# Patient Record
Sex: Male | Born: 1970 | Race: White | Hispanic: No | Marital: Married | Smoking: Never smoker
Health system: Southern US, Community
[De-identification: ages and names within clinical notes are randomized; demographics above are authoritative.]

## PROBLEM LIST (undated history)

## (undated) DIAGNOSIS — M5126 Other intervertebral disc displacement, lumbar region: Secondary | ICD-10-CM

## (undated) DIAGNOSIS — I10 Essential (primary) hypertension: Secondary | ICD-10-CM

## (undated) DIAGNOSIS — N289 Disorder of kidney and ureter, unspecified: Secondary | ICD-10-CM

## (undated) HISTORY — PX: REFRACTIVE SURGERY: SHX103

## (undated) HISTORY — PX: BACK SURGERY: SHX140

---

## 2007-10-13 HISTORY — PX: HERNIA REPAIR: SHX51

## 2012-01-05 ENCOUNTER — Other Ambulatory Visit: Payer: Self-pay | Admitting: Otolaryngology

## 2012-01-19 ENCOUNTER — Encounter (HOSPITAL_COMMUNITY)
Admission: RE | Admit: 2012-01-19 | Discharge: 2012-01-19 | Disposition: A | Discharge status: Home / Self Care | Payer: Worker's Compensation | Source: Ambulatory Visit | Attending: Specialist | Admitting: Specialist

## 2012-01-19 ENCOUNTER — Other Ambulatory Visit: Payer: Self-pay | Admitting: Otolaryngology

## 2012-01-19 ENCOUNTER — Encounter (HOSPITAL_COMMUNITY): Payer: Self-pay

## 2012-01-19 ENCOUNTER — Ambulatory Visit (HOSPITAL_COMMUNITY)
Admission: RE | Admit: 2012-01-19 | Discharge: 2012-01-19 | Disposition: A | Discharge status: Home / Self Care | Payer: Worker's Compensation | Source: Ambulatory Visit | Attending: Otolaryngology | Admitting: Otolaryngology

## 2012-01-19 DIAGNOSIS — M545 Low back pain, unspecified: Secondary | ICD-10-CM | POA: Insufficient documentation

## 2012-01-19 DIAGNOSIS — M47817 Spondylosis without myelopathy or radiculopathy, lumbosacral region: Secondary | ICD-10-CM | POA: Insufficient documentation

## 2012-01-19 DIAGNOSIS — Z01812 Encounter for preprocedural laboratory examination: Secondary | ICD-10-CM | POA: Insufficient documentation

## 2012-01-19 DIAGNOSIS — IMO0002 Reserved for concepts with insufficient information to code with codable children: Secondary | ICD-10-CM | POA: Insufficient documentation

## 2012-01-19 HISTORY — DX: Other intervertebral disc displacement, lumbar region: M51.26

## 2012-01-19 LAB — URINALYSIS, ROUTINE W REFLEX MICROSCOPIC
Bilirubin Urine: NEGATIVE
Leukocytes, UA: NEGATIVE
Nitrite: NEGATIVE
Specific Gravity, Urine: 1.015 (ref 1.005–1.030)
pH: 5.5 (ref 5.0–8.0)

## 2012-01-19 LAB — SURGICAL PCR SCREEN: MRSA, PCR: NEGATIVE

## 2012-01-19 LAB — URINE MICROSCOPIC-ADD ON

## 2012-01-19 LAB — COMPREHENSIVE METABOLIC PANEL
ALT: 28 U/L (ref 0–53)
AST: 21 U/L (ref 0–37)
Calcium: 9.4 mg/dL (ref 8.4–10.5)
GFR calc Af Amer: >90 mL/min (ref >90)
Glucose, Bld: 84 mg/dL (ref 70–99)
Sodium: 137 mEq/L (ref 135–145)
Total Protein: 7.3 g/dL (ref 6.0–8.3)

## 2012-01-19 LAB — CBC
MCH: 30.1 pg (ref 26.0–34.0)
MCHC: 36.8 g/dL — ABNORMAL HIGH (ref 30.0–36.0)
Platelets: 266 10*3/uL (ref 150–400)

## 2012-01-19 LAB — DIFFERENTIAL
Eosinophils Relative: 5 % (ref 0–5)
Lymphocytes Relative: 36 % (ref 12–46)
Lymphs Abs: 2.3 10*3/uL (ref 0.7–4.0)
Monocytes Relative: 8 % (ref 3–12)

## 2012-01-19 MED ORDER — CHLORHEXIDINE GLUCONATE 4 % EX LIQD
60.0000 mL | Freq: Once | CUTANEOUS | Status: DC
  Filled 2012-01-19: qty 60

## 2012-01-19 NOTE — Patient Instructions (Addendum)
[name]  01/19/2012   Your procedure is scheduled on:  01/29/12  Report to SHORT STAY DEPT  at 10:30 AM.  Call this number if you have problems the morning of surgery: [phone]   Remember:   Do not eat food or drink liquids AFTER MIDNIGHT  May have clear liquids UNTIL 6 HOURS BEFORE SURGERY (7:00 AM)  Clear liquids include soda, tea, black coffee, apple or grape juice, broth.  Take these medicines the morning of surgery with A SIP OF WATER: HYDROCODONE / SOMA   Do not wear jewelry, make-up or nail polish.  Do not wear lotions, powders, or perfumes.   Do not shave legs or underarms 48 hrs. before surgery (men may shave face)  Do not bring valuables to the hospital.  Contacts, dentures or bridgework may not be worn into surgery.  Leave suitcase in the car. After surgery it may be brought to your room.  For patients admitted to the hospital, checkout time is 11:00 AM the day of discharge.   Patients discharged the day of surgery will not be allowed to drive home.  Name and phone number of your driver:   Special Instructions:   Please read over the following fact sheets that you were given: MRSA  Information / Incentive Spirometer               SHOWER WITH BETASEPT THE NIGHT BEFORE SURGERY AND THE MORNING OF SURGERY

## 2012-01-20 NOTE — Pre-Procedure Instructions (Signed)
01/19/12 MESSAGE LEFT WITH AMANDA AT OFFICE WHO SENT MESSAGE TO DR. Shelle Iron CONCERNING [facility] ON [name]

## 2012-01-29 ENCOUNTER — Ambulatory Visit (HOSPITAL_COMMUNITY): Payer: Worker's Compensation

## 2012-01-29 ENCOUNTER — Encounter (HOSPITAL_COMMUNITY): Payer: Self-pay | Admitting: Anesthesiology

## 2012-01-29 ENCOUNTER — Encounter (HOSPITAL_COMMUNITY): Payer: Self-pay | Admitting: *Deleted

## 2012-01-29 ENCOUNTER — Encounter (HOSPITAL_COMMUNITY)
Admission: RE | Disposition: A | Discharge status: Home / Self Care | Payer: Self-pay | Source: Ambulatory Visit | Attending: Specialist

## 2012-01-29 ENCOUNTER — Observation Stay (HOSPITAL_COMMUNITY)
Admission: RE | Admit: 2012-01-29 | Discharge: 2012-01-30 | Disposition: A | Discharge status: Home / Self Care | Payer: Worker's Compensation | Source: Ambulatory Visit | Attending: Specialist | Admitting: Specialist

## 2012-01-29 ENCOUNTER — Ambulatory Visit (HOSPITAL_COMMUNITY): Payer: Worker's Compensation | Admitting: Anesthesiology

## 2012-01-29 DIAGNOSIS — M5126 Other intervertebral disc displacement, lumbar region: Principal | ICD-10-CM | POA: Insufficient documentation

## 2012-01-29 SURGERY — DECOMPRESSIVE LUMBAR LAMINECTOMY LEVEL 1
Anesthesia: General | Site: Back | Laterality: Bilateral | Wound class: Clean

## 2012-01-29 MED ORDER — SODIUM CHLORIDE 0.9 % IJ SOLN: 3.0000 mL | Freq: Two times a day (BID) | INTRAMUSCULAR | Status: DC

## 2012-01-29 MED ORDER — SODIUM CHLORIDE 0.9 % IR SOLN
Status: DC | PRN
  Administered 2012-01-29: 14:00:00

## 2012-01-29 MED ORDER — PROPOFOL 10 MG/ML IV EMUL
INTRAVENOUS | Status: DC | PRN
  Administered 2012-01-29: 300 mg via INTRAVENOUS

## 2012-01-29 MED ORDER — POLYETHYLENE GLYCOL 3350 17 G PO PACK
17.0000 g | PACK | Freq: Every day | ORAL | Status: DC | PRN
  Filled 2012-01-29: qty 1

## 2012-01-29 MED ORDER — ACETAMINOPHEN 10 MG/ML IV SOLN
INTRAVENOUS | Status: AC
  Filled 2012-01-29: qty 100

## 2012-01-29 MED ORDER — ZOLPIDEM TARTRATE 10 MG PO TABS: 10.0000 mg | ORAL_TABLET | Freq: Every evening | ORAL | Status: DC | PRN

## 2012-01-29 MED ORDER — CEFAZOLIN SODIUM 1-5 GM-% IV SOLN
1.0000 g | Freq: Three times a day (TID) | INTRAVENOUS | Status: AC
  Administered 2012-01-29 – 2012-01-30 (×2): 1 g via INTRAVENOUS
  Filled 2012-01-29 (×2): qty 50

## 2012-01-29 MED ORDER — DROPERIDOL 2.5 MG/ML IJ SOLN
INTRAMUSCULAR | Status: DC | PRN
  Administered 2012-01-29: 5 mg via INTRAVENOUS

## 2012-01-29 MED ORDER — CEFAZOLIN SODIUM-DEXTROSE 2-3 GM-% IV SOLR
INTRAVENOUS | Status: AC
  Filled 2012-01-29: qty 50

## 2012-01-29 MED ORDER — BUPIVACAINE-EPINEPHRINE 0.5% -1:200000 IJ SOLN
INTRAMUSCULAR | Status: DC | PRN
  Administered 2012-01-29: 18 mL

## 2012-01-29 MED ORDER — SODIUM CHLORIDE 0.9 % IV SOLN
INTRAVENOUS | Status: DC
  Administered 2012-01-30: 04:00:00 via INTRAVENOUS

## 2012-01-29 MED ORDER — EPHEDRINE SULFATE 50 MG/ML IJ SOLN
INTRAMUSCULAR | Status: DC | PRN
  Administered 2012-01-29 (×3): 10 mg via INTRAVENOUS

## 2012-01-29 MED ORDER — BISACODYL 5 MG PO TBEC: 5.0000 mg | DELAYED_RELEASE_TABLET | Freq: Every day | ORAL | Status: DC | PRN

## 2012-01-29 MED ORDER — CEFAZOLIN SODIUM-DEXTROSE 2-3 GM-% IV SOLR
2.0000 g | INTRAVENOUS | Status: AC
  Administered 2012-01-29: 2 g via INTRAVENOUS

## 2012-01-29 MED ORDER — ONDANSETRON HCL 4 MG/2ML IJ SOLN: 4.0000 mg | INTRAMUSCULAR | Status: DC | PRN

## 2012-01-29 MED ORDER — HYDROCODONE-ACETAMINOPHEN 7.5-325 MG PO TABS
1.0000 | ORAL_TABLET | ORAL | Status: DC | PRN
  Administered 2012-01-29 – 2012-01-30 (×4): 2 via ORAL
  Filled 2012-01-29 (×4): qty 2

## 2012-01-29 MED ORDER — KETAMINE HCL 10 MG/ML IJ SOLN
INTRAMUSCULAR | Status: DC | PRN
  Administered 2012-01-29: 5 mg via INTRAVENOUS
  Administered 2012-01-29: 2.5 mg via INTRAVENOUS
  Administered 2012-01-29: 5 mg via INTRAVENOUS
  Administered 2012-01-29: 2.5 mg via INTRAVENOUS
  Administered 2012-01-29: 5 mg via INTRAVENOUS

## 2012-01-29 MED ORDER — CARISOPRODOL 350 MG PO TABS: 350.0000 mg | ORAL_TABLET | Freq: Four times a day (QID) | ORAL | Status: DC | PRN

## 2012-01-29 MED ORDER — LACTATED RINGERS IV SOLN
INTRAVENOUS | Status: DC | PRN
  Administered 2012-01-29 (×3): via INTRAVENOUS

## 2012-01-29 MED ORDER — CISATRACURIUM BESYLATE 2 MG/ML IV SOLN
INTRAVENOUS | Status: DC | PRN
  Administered 2012-01-29: 2 mg via INTRAVENOUS
  Administered 2012-01-29: 8 mg via INTRAVENOUS
  Administered 2012-01-29: 2 mg via INTRAVENOUS
  Administered 2012-01-29 (×3): 1 mg via INTRAVENOUS
  Administered 2012-01-29: 2 mg via INTRAVENOUS

## 2012-01-29 MED ORDER — HYDROMORPHONE HCL PF 1 MG/ML IJ SOLN: 0.2500 mg | INTRAMUSCULAR | Status: DC | PRN

## 2012-01-29 MED ORDER — HYDROMORPHONE HCL PF 1 MG/ML IJ SOLN
0.5000 mg | INTRAMUSCULAR | Status: DC | PRN
Start: 2012-01-29 — End: 2012-01-30
  Administered 2012-01-29 – 2012-01-30 (×3): 1 mg via INTRAVENOUS
  Filled 2012-01-29 (×2): qty 1

## 2012-01-29 MED ORDER — FENTANYL CITRATE 0.05 MG/ML IJ SOLN
INTRAMUSCULAR | Status: DC | PRN
  Administered 2012-01-29 (×3): 50 ug via INTRAVENOUS
  Administered 2012-01-29: 100 ug via INTRAVENOUS

## 2012-01-29 MED ORDER — THROMBIN 5000 UNITS EX SOLR
CUTANEOUS | Status: DC | PRN
  Administered 2012-01-29: 5000 [IU] via TOPICAL

## 2012-01-29 MED ORDER — ACETAMINOPHEN 325 MG PO TABS: 650.0000 mg | ORAL_TABLET | ORAL | Status: DC | PRN

## 2012-01-29 MED ORDER — MIDAZOLAM HCL 5 MG/5ML IJ SOLN
INTRAMUSCULAR | Status: DC | PRN
  Administered 2012-01-29: 1 mg via INTRAVENOUS

## 2012-01-29 MED ORDER — HYDROCODONE-ACETAMINOPHEN 7.5-325 MG PO TABS: 1.0000 | ORAL_TABLET | ORAL | Status: DC | PRN

## 2012-01-29 MED ORDER — PROMETHAZINE HCL 25 MG/ML IJ SOLN: 6.2500 mg | INTRAMUSCULAR | Status: DC | PRN

## 2012-01-29 MED ORDER — THROMBIN 5000 UNITS EX SOLR
CUTANEOUS | Status: AC
  Filled 2012-01-29: qty 10000

## 2012-01-29 MED ORDER — PHENOL 1.4 % MT LIQD: 1.0000 | OROMUCOSAL | Status: DC | PRN

## 2012-01-29 MED ORDER — SODIUM CHLORIDE 0.9 % IJ SOLN: 3.0000 mL | INTRAMUSCULAR | Status: DC | PRN

## 2012-01-29 MED ORDER — SODIUM CHLORIDE 0.9 % IV SOLN: 250.0000 mL | INTRAVENOUS | Status: DC

## 2012-01-29 MED ORDER — ACETAMINOPHEN 650 MG RE SUPP: 650.0000 mg | RECTAL | Status: DC | PRN

## 2012-01-29 MED ORDER — ONDANSETRON HCL 4 MG/2ML IJ SOLN
INTRAMUSCULAR | Status: DC | PRN
  Administered 2012-01-29 (×2): 2 mg via INTRAVENOUS

## 2012-01-29 MED ORDER — LIDOCAINE HCL (CARDIAC) 20 MG/ML IV SOLN
INTRAVENOUS | Status: DC | PRN
  Administered 2012-01-29: 20 mg via INTRAVENOUS

## 2012-01-29 MED ORDER — BUPIVACAINE-EPINEPHRINE (PF) 0.5% -1:200000 IJ SOLN
INTRAMUSCULAR | Status: AC
  Filled 2012-01-29: qty 10

## 2012-01-29 MED ORDER — CARISOPRODOL 350 MG PO TABS
350.0000 mg | ORAL_TABLET | Freq: Four times a day (QID) | ORAL | Status: DC | PRN
  Administered 2012-01-29 – 2012-01-30 (×3): 350 mg via ORAL
  Filled 2012-01-29 (×3): qty 1

## 2012-01-29 MED ORDER — HYDROMORPHONE HCL PF 1 MG/ML IJ SOLN
INTRAMUSCULAR | Status: DC | PRN
  Administered 2012-01-29 (×2): 1 mg via INTRAVENOUS

## 2012-01-29 MED ORDER — MENTHOL 3 MG MT LOZG: 1.0000 | LOZENGE | OROMUCOSAL | Status: DC | PRN

## 2012-01-29 MED ORDER — METHOCARBAMOL 500 MG PO TABS: 500.0000 mg | ORAL_TABLET | Freq: Four times a day (QID) | ORAL | Status: DC | PRN

## 2012-01-29 MED ORDER — DOCUSATE SODIUM 100 MG PO CAPS
100.0000 mg | ORAL_CAPSULE | Freq: Two times a day (BID) | ORAL | Status: DC
  Administered 2012-01-29 – 2012-01-30 (×2): 100 mg via ORAL
  Filled 2012-01-29 (×4): qty 1

## 2012-01-29 MED ORDER — SUCCINYLCHOLINE CHLORIDE 20 MG/ML IJ SOLN
INTRAMUSCULAR | Status: DC | PRN
  Administered 2012-01-29: 160 mg via INTRAVENOUS

## 2012-01-29 MED ORDER — METHOCARBAMOL 100 MG/ML IJ SOLN
500.0000 mg | Freq: Four times a day (QID) | INTRAVENOUS | Status: DC | PRN
  Administered 2012-01-30: 500 mg via INTRAVENOUS
  Filled 2012-01-29 (×2): qty 5

## 2012-01-29 MED ORDER — LACTATED RINGERS IV SOLN
INTRAVENOUS | Status: DC
  Administered 2012-01-29: 1000 mL via INTRAVENOUS

## 2012-01-29 MED ORDER — ACETAMINOPHEN 10 MG/ML IV SOLN
INTRAVENOUS | Status: DC | PRN
  Administered 2012-01-29: 1000 mg via INTRAVENOUS

## 2012-01-29 SURGICAL SUPPLY — 50 items
BAG ZIPLOCK 12X15 (MISCELLANEOUS) ×2 IMPLANT
BENZOIN TINCTURE PRP APPL 2/3 (GAUZE/BANDAGES/DRESSINGS) ×2 IMPLANT
CHLORAPREP W/TINT 26ML (MISCELLANEOUS) IMPLANT
CLEANER TIP ELECTROSURG 2X2 (MISCELLANEOUS) ×2 IMPLANT
CLOTH BEACON ORANGE TIMEOUT ST (SAFETY) ×2 IMPLANT
CLSR STERI-STRIP ANTIMIC 1/2X4 (GAUZE/BANDAGES/DRESSINGS) ×2 IMPLANT
DECANTER SPIKE VIAL GLASS SM (MISCELLANEOUS) ×2 IMPLANT
DRAPE MICROSCOPE LEICA (MISCELLANEOUS) ×2 IMPLANT
DRAPE POUCH INSTRU U-SHP 10X18 (DRAPES) ×2 IMPLANT
DRAPE SURG 17X11 SM STRL (DRAPES) ×2 IMPLANT
DRSG EMULSION OIL 3X3 NADH (GAUZE/BANDAGES/DRESSINGS) IMPLANT
DRSG PAD ABDOMINAL 8X10 ST (GAUZE/BANDAGES/DRESSINGS) IMPLANT
DRSG TELFA 4X14 ISLAND ADH (GAUZE/BANDAGES/DRESSINGS) ×2 IMPLANT
DRSG TELFA 4X5 ISLAND ADH (GAUZE/BANDAGES/DRESSINGS) IMPLANT
DURAPREP 26ML APPLICATOR (WOUND CARE) ×2 IMPLANT
ELECT BLADE TIP CTD 4 INCH (ELECTRODE) ×2 IMPLANT
ELECT REM PT RETURN 9FT ADLT (ELECTROSURGICAL) ×2
ELECTRODE REM PT RTRN 9FT ADLT (ELECTROSURGICAL) ×1 IMPLANT
GLOVE BIOGEL PI IND STRL 8 (GLOVE) ×1 IMPLANT
GLOVE BIOGEL PI INDICATOR 8 (GLOVE) ×1
GLOVE ECLIPSE 6.5 STRL STRAW (GLOVE) ×2 IMPLANT
GLOVE INDICATOR 6.5 STRL GRN (GLOVE) ×2 IMPLANT
GLOVE SURG SS PI 8.0 STRL IVOR (GLOVE) ×4 IMPLANT
GOWN STRL NON-REIN LRG LVL3 (GOWN DISPOSABLE) ×2 IMPLANT
GOWN STRL REIN XL XLG (GOWN DISPOSABLE) ×2 IMPLANT
KIT BASIN OR (CUSTOM PROCEDURE TRAY) ×2 IMPLANT
KIT POSITIONING SURG ANDREWS (MISCELLANEOUS) ×2 IMPLANT
MANIFOLD NEPTUNE II (INSTRUMENTS) ×2 IMPLANT
NEEDLE SPNL 18GX3.5 QUINCKE PK (NEEDLE) ×4 IMPLANT
PATTIES SURGICAL .5 X.5 (GAUZE/BANDAGES/DRESSINGS) IMPLANT
PATTIES SURGICAL .75X.75 (GAUZE/BANDAGES/DRESSINGS) IMPLANT
PATTIES SURGICAL 1X1 (DISPOSABLE) IMPLANT
SPONGE SURGIFOAM ABS GEL 100 (HEMOSTASIS) ×2 IMPLANT
STAPLER VISISTAT (STAPLE) IMPLANT
STRIP CLOSURE SKIN 1/2X4 (GAUZE/BANDAGES/DRESSINGS) IMPLANT
SUT PROLENE 3 0 PS 2 (SUTURE) ×2 IMPLANT
SUT VIC AB 0 CT1 27 (SUTURE)
SUT VIC AB 0 CT1 27XBRD ANTBC (SUTURE) IMPLANT
SUT VIC AB 1 CT1 27 (SUTURE)
SUT VIC AB 1 CT1 27XBRD ANTBC (SUTURE) IMPLANT
SUT VIC AB 1-0 CT2 27 (SUTURE) ×2 IMPLANT
SUT VIC AB 2-0 CT1 27 (SUTURE)
SUT VIC AB 2-0 CT1 TAPERPNT 27 (SUTURE) IMPLANT
SUT VIC AB 2-0 CT2 27 (SUTURE) ×2 IMPLANT
SUT VICRYL 0 UR6 27IN ABS (SUTURE) IMPLANT
SYR 50ML LL SCALE MARK (SYRINGE) ×2 IMPLANT
SYRINGE 10CC LL (SYRINGE) ×2 IMPLANT
TAPE STRIPS DRAPE STRL (GAUZE/BANDAGES/DRESSINGS) ×2 IMPLANT
TRAY LAMINECTOMY (CUSTOM PROCEDURE TRAY) ×2 IMPLANT
YANKAUER SUCT BULB TIP NO VENT (SUCTIONS) ×2 IMPLANT

## 2012-01-29 NOTE — H&P (Signed)
[name] is an 41 y.o. male.   Chief Complaint: Bil leg pain HPI: HnP L5S1 with stenosis bilat  Past Medical History  Diagnosis Date  . HNP (herniated nucleus pulposus), lumbar     Past Surgical History  Procedure Date  . Hernia repair 2009  . Refractive surgery     History reviewed. No pertinent family history. Social History:  reports that he has never smoked. He uses smokeless tobacco. He reports that he does not drink alcohol or use illicit drugs.  Allergies: No Known Allergies  Medications Prior to Admission  Medication Dose Route Frequency Provider Last Rate Last Dose  . ceFAZolin (ANCEF) IVPB 2 g/50 mL premix  2 g Intravenous 60 min Pre-Op Liam Graham, PA      . lactated ringers infusion   Intravenous Continuous Liam Graham, PA 50 mL/hr at 01/29/12 1226 1,000 mL at 01/29/12 1226   Medications Prior to Admission  Medication Sig Dispense Refill  . carisoprodol (SOMA) 350 MG tablet Take 350 mg by mouth 4 (four) times daily as needed.      Marland Kitchen HYDROcodone-acetaminophen (NORCO) 7.5-325 MG per tablet Take 1 tablet by mouth every 6 (six) hours as needed. Pain        No results found for this or any previous visit (from the past 48 hour(s)). No results found.  Review of Systems  Musculoskeletal: Positive for back pain.  Neurological: Positive for tingling and focal weakness.  All other systems reviewed and are negative.    Blood pressure 112/78, pulse 80, temperature 97.8 F (36.6 C), temperature source Oral, resp. rate 18, SpO2 97.00%. Physical Exam  Constitutional: He is oriented to person, place, and time. He appears well-developed.  HENT:  Head: Normocephalic.  Eyes: Pupils are equal, round, and reactive to light.  Neck: Normal range of motion.  Cardiovascular: Normal rate.   Respiratory: Effort normal.  GI: Soft.  Musculoskeletal: He exhibits tenderness.       Bilat SLR positive. EHL 5-/5 right. Decreased sensation  S1 right and  left.  Neurological: He is alert and oriented to person, place, and time.  Skin: Skin is warm and dry.  Psychiatric: He has a normal mood and affect.     Assessment/Plan HNP L5S1 stensois bilat. Plan Decompression Bilat L5S1 bilat. Risks discussed.  Carleigh Buccieri C 01/29/2012, 12:51 PM

## 2012-01-29 NOTE — Discharge Instructions (Signed)
Walk As Tolerated utilizing back precautions.  No bending, twisting, or lifting.  No driving for 2 weeks.  Ok to shower in 72 hours.  Use good body mechanics. Change dressing daily.

## 2012-01-29 NOTE — Brief Op Note (Signed)
01/29/2012  3:24 PM  PATIENT:  [name]  41 y.o. male  PRE-OPERATIVE DIAGNOSIS:  Herniated Disc  POST-OPERATIVE DIAGNOSIS:  Herniated Disc  PROCEDURE:  Procedure(s) (LRB): DECOMPRESSIVE LUMBAR LAMINECTOMY LEVEL 1 (Bilateral)  SURGEON:  Surgeon(s) and Role:    * Javier Docker, MD - Primary  PHYSICIAN ASSISTANT:   ASSISTANTS: strader   ANESTHESIA:   general  EBL:  Total I/O In: 2000 [I.V.:2000] Out: 100 [Blood:100]  BLOOD ADMINISTERED:none  DRAINS: none   LOCAL MEDICATIONS USED:  MARCAINE     SPECIMEN:  No Specimen  DISPOSITION OF SPECIMEN:  PATHOLOGY  COUNTS:  YES  TOURNIQUET:  * No tourniquets in log *  DICTATION: .Other Dictation: Dictation Number [id number]  PLAN OF CARE: Admit for overnight observation  PATIENT DISPOSITION:  PACU - hemodynamically stable.   Delay start of Pharmacological VTE agent (>24hrs) due to surgical blood loss or risk of bleeding: yes

## 2012-01-29 NOTE — Transfer of Care (Signed)
Immediate Anesthesia Transfer of Care Note  Patient: [name]  Procedure(s) Performed: Procedure(s) (LRB): DECOMPRESSIVE LUMBAR LAMINECTOMY LEVEL 1 (Bilateral)  Patient Location: PACU  Anesthesia Type: General  Level of Consciousness: awake, alert , oriented and patient cooperative  Airway & Oxygen Therapy: Patient Spontanous Breathing and Patient connected to face mask oxygen  Post-op Assessment: Report given to PACU RN, Post -op Vital signs reviewed and stable and Patient moving all extremities X 4  Post vital signs: Reviewed and stable  Complications: No apparent anesthesia complications

## 2012-01-29 NOTE — Anesthesia Postprocedure Evaluation (Signed)
  Anesthesia Post-op Note  Patient: [name]  Procedure(s) Performed: Procedure(s) (LRB): DECOMPRESSIVE LUMBAR LAMINECTOMY LEVEL 1 (Bilateral)  Patient Location: PACU  Anesthesia Type: General  Level of Consciousness: awake and alert   Airway and Oxygen Therapy: Patient Spontanous Breathing  Post-op Pain: mild  Post-op Assessment: Post-op Vital signs reviewed, Patient's Cardiovascular Status Stable, Respiratory Function Stable, Patent Airway and No signs of Nausea or vomiting  Post-op Vital Signs: stable  Complications: No apparent anesthesia complications

## 2012-01-29 NOTE — Anesthesia Preprocedure Evaluation (Signed)
Anesthesia Evaluation  Patient identified by MRN, date of birth, ID band Patient awake    Reviewed: Allergy & Precautions, H&P , NPO status , Patient's Chart, lab work & pertinent test results  Airway Mallampati: II TM Distance: >3 FB Neck ROM: Full    Dental No notable dental hx.    Pulmonary neg pulmonary ROS,  breath sounds clear to auscultation  Pulmonary exam normal       Cardiovascular negative cardio ROS  Rhythm:Regular Rate:Normal     Neuro/Psych negative neurological ROS  negative psych ROS   GI/Hepatic negative GI ROS, Neg liver ROS,   Endo/Other  negative endocrine ROS  Renal/GU negative Renal ROS  negative genitourinary   Musculoskeletal negative musculoskeletal ROS (+)   Abdominal (+) + obese,   Peds negative pediatric ROS (+)  Hematology negative hematology ROS (+)   Anesthesia Other Findings   Reproductive/Obstetrics negative OB ROS                           Anesthesia Physical Anesthesia Plan  ASA: II  Anesthesia Plan: General   Post-op Pain Management:    Induction: Intravenous  Airway Management Planned: Oral ETT  Additional Equipment:   Intra-op Plan:   Post-operative Plan: Extubation in OR  Informed Consent: I have reviewed the patients History and Physical, chart, labs and discussed the procedure including the risks, benefits and alternatives for the proposed anesthesia with the patient or authorized representative who has indicated his/her understanding and acceptance.   Dental advisory given  Plan Discussed with: CRNA  Anesthesia Plan Comments:         Anesthesia Quick Evaluation  

## 2012-01-30 MED ORDER — POLYETHYLENE GLYCOL 3350 17 G PO PACK
17.0000 g | PACK | Freq: Every day | ORAL | Status: DC | PRN
  Filled 2012-01-30: qty 1

## 2012-01-30 MED ORDER — BISACODYL 10 MG RE SUPP: 10.0000 mg | Freq: Every day | RECTAL | Status: DC | PRN

## 2012-01-30 NOTE — Op Note (Signed)
NAMEMIKING, COVIN         ACCOUNT NO.:  [id number]  MEDICAL RECORD NO.:  [id number]  LOCATION:  1619                         FACILITY:  [facility]  PHYSICIAN:  Jene Every, M.D.    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  PROCEDURES PERFORMED: 1. Bilateral hemilaminotomy with lateral recess decompressions and     foraminotomies of L4-5, L5-S1. 2. Microdiskectomy of L5-S1 bilaterally.  ANESTHESIA:  General.  ASSISTANT:  Roma Schanz, P.A.  HISTORY:  A 41 year old with bilateral lower extremity radicular pain. The patient has disk herniation of L5-S1 paracentral to the right.  He had predominant right lower extremity pain, then left lower extremity. On the day of the procedure, he complained of bilateral lower extremity radicular pain with positive neural tension signs, EHL weakness, particularly on the right.  Also, he had radicular symptoms on the left as well as a disk herniation, to the right central and to the left into the lateral recess.  It was indicative for lumbar decompression of L5-S1 bilaterally.  Risk and benefits were discussed including bleeding, infection, damage to vascular structures, no change in symptoms, worsening symptoms, need for repeat debridement, DVT, PE, anesthetic complications etc.  TECHNIQUE:  With the patient in supine position, after induction of adequate general anesthesia, 2 g Kefzol, placed prone on the Dresser frame.  All bony prominences were well padded.  Lumbar region was prepped and draped in the usual sterile fashion.  An 18-gauge spinal needle was utilized to localize L5-S1 interspace, confirmed with x-ray. The disk space was the last open disk space and there  was a transitional vertebra, which on the plain x-rays in the AP and lateral was labeled as L4-5; however, intraoperatively we confirmed the disk space and the operative space with the MRI and the x-ray reports with the local radiologist.  Paraspinous muscles were elevated from the lamina of the last open disk space and L5-S1 bilaterally.  McCullough retractor was placed and operating microscope was draped on the surgical field.  We obtained confirmatory radiographs.  Small interlaminar windows were noted bilaterally.  We detached ligamentum flavum first from the cephalad edge of S1, utilized a straight curette and performed hemilaminotomy of the caudad edge of L5.  Ligamentum flavum was removed from the interspace. There was severe lateral recess stenosis noted,  multifactorial, due to disk herniation of ligamentum flavum and facet hypertrophy.  Gently mobilized the thecal sac medially with a D'Errico and decompressed the lateral recess to the medial border pedicle performing foraminotomies of S1 and L5.  There was neural foraminal narrowing noted here as well due to disk space narrowing and a large focal HNP was noted.  An annulotomy was performed.  Copious portions of disk material was removed from the disk space with straight upbiting pituitary.  Following a full decompression of herniated material, we irrigated disk space, had  1 cm of excursion of medial pedicle without difficulty.  We turned our attention then to the left in a similar fashion, decompressed  and entered the laminar space of L5-S1 on left, decompressed the lateral recess to the medial border of the pedicle, performed foraminotomy of L5 and S1.  It was stenotic in the lateral recess here as well and the HNP was noted here.  We performed an annulotomy and removed the disk material from the disk space with a micropituitary.  Following that, we had at least 1 cm excursion of the S1 nerve root beneath the pedicle without  difficulty.  No herniated material in the shoulder, the axilla, the root or the foramen of L5 and S1.  We turned our attention back to the right with additional observation and noted was a large fragment presumably migrated from the decompression at L5-S1.  This was then retrieved with a nerve hook and a micropituitary and samples were sent to Pathology for appropriate evaluation.  This space was copiously irrigated with antibiotic irrigation and the hockey-stick probe passed freely up the foramen L5 and S1, 1 cm of excursion of the S1 nerve root Beneath the pedicle without tension.  Bipolar electrocautery was utilized to achieve hemostasis.  We draped epidural fat over the S1 nerve root bilaterally.  Final confirmatory radiograph was obtained with a Penfield 4 in the space.  We removed the [facility] retractor. Paraspinous muscles were irrigated.  There was no active bleeding or CSF leakage.  Dorsolumbar fascia was reapproximated with #1 Vicryl interrupted figure-of-8 suture, subcu with 2-0 Vicryl simple suture. Skin was reapproximated with 4-0 subcuticular Prolene.  The wound was reinforced with Steri-Strips.  Sterile dressing was applied.  The patient was placed supine on the hospital bed, extubated without difficulty and transported to the recovery room in satisfactory condition.  The patient tolerated the procedure well.  There were no complications.     Jene Every, M.D.     Cordelia Pen  D:  01/29/2012  T:  01/30/2012  Job:  [id number]

## 2012-01-30 NOTE — Plan of Care (Signed)
Problem: Consults Goal: Diagnosis - Spinal Surgery Outcome: Completed/Met Date Met:  01/30/12 Microdiscectomy

## 2012-01-30 NOTE — Plan of Care (Signed)
Problem: Consults Goal: Diagnosis - Spinal Surgery Microdiscectomy     

## 2012-01-30 NOTE — Evaluation (Signed)
Occupational Therapy Evaluation Patient Details Name: [name] MRN: [id number] DOB: Jun 08, 1971 Today's Date: 01/30/2012 Time: 2841-3244 OT Time Calculation (min): 23 min  OT Assessment / Plan / Recommendation Clinical Impression  Pt educated on all back precautions and implications for ADL. No further questions or concerns. Sign off OT    OT Assessment  Patient does not need any further OT services    Follow Up Recommendations  No OT follow up    Equipment Recommendations  Cane    Frequency      Precautions / Restrictions Precautions Precautions: Back Precaution Booklet Issued: Yes (comment) Precaution Comments: educated on precautions/reviewed and handout already in room by PT Restrictions Weight Bearing Restrictions: No   Pertinent Vitals/Pain 3/10 back. Rest; reposition    ADL  Eating/Feeding: Simulated;Independent Where Assessed - Eating/Feeding: Edge of bed Grooming: Simulated;Supervision/safety Where Assessed - Grooming: Standing at sink Upper Body Bathing: Simulated;Chest;Right arm;Left arm;Abdomen;Set up;Supervision/safety Where Assessed - Upper Body Bathing: Sitting, bed Lower Body Bathing: Simulated;Minimal assistance;Other (comment) (for feet for thoroughness. ) Where Assessed - Lower Body Bathing: Sit to stand from bed Upper Body Dressing: Simulated;Supervision/safety;Set up Where Assessed - Upper Body Dressing: Sitting, bed;Unsupported Lower Body Dressing: Moderate assistance;Other (comment) (pt states it is still sore and tight to cross legs up. ) Where Assessed - Lower Body Dressing: Sit to stand from bed Toilet Transfer: Simulated;Supervision/safety;Other (comment) (holding IV pole) Toilet Transfer Method: Ambulating Toileting - Clothing Manipulation: Simulated;Supervision/safety Where Assessed - Toileting Clothing Manipulation: Standing Toileting - Hygiene: Simulated;Supervision/safety Where Assessed - Toileting Hygiene:  Standing Tub/Shower Transfer: Not assessed Tub/Shower Transfer Method: Not assessed Equipment Used: Long-handled shoe horn;Long-handled sponge;Reacher;Sock aid ADL Comments: educated pt/wife on AE options but pt states wife can assist PRN. he is able to partially cross legs up but limited today due to soreness/tightness in legs. Encouraged pt to try to continue to work on this each day. Pt declined need to practice with actual 3in1 as he states he is familiar with how to use it from when his father had one. Pt states he can obtain 3in1 to use initially at discharge. Pt states he will sponge bathe initially but did demonstrate tub transfer technique for when pt is ready and benefit of LHS. Reviewed handout and no further needs expressed by pt or wife. Pt states he feels much better with his pain than earlier this am. He feels ready to discharge today per his report.     OT Goals    Visit Information  Last OT Received On: 01/30/12 Assistance Needed: +1    Subjective Data  Subjective: I am feeling much better than this morning Patient Stated Goal: home   Prior Functioning  Home Living Lives With: Spouse Available Help at Discharge: Family Type of Home: House Home Access: Level entry Home Layout: One level Bathroom Shower/Tub: Engineer, manufacturing systems: Standard Home Adaptive Equipment: Bedside commode/3-in-1 Additional Comments: can borrow a 3in1 from father Prior Function Level of Independence: Independent Able to Take Stairs?: Yes Driving: Yes Vocation: Full time employment Communication Communication: No difficulties    Cognition  Overall Cognitive Status: Appears within functional limits for tasks assessed/performed Arousal/Alertness: Awake/alert Orientation Level: Appears intact for tasks assessed Behavior During Session: [facility] for tasks performed    Extremity/Trunk Assessment Right Upper Extremity Assessment RUE ROM/Strength/Tone: Within functional levels RUE  Sensation: WFL - Light Touch RUE Coordination: WFL - gross/fine motor Left Upper Extremity Assessment LUE ROM/Strength/Tone: Within functional levels LUE Sensation: WFL - Light Touch LUE Coordination: WFL - gross/fine  motor Right Lower Extremity Assessment RLE ROM/Strength/Tone: Within functional levels RLE Sensation: WFL - Light Touch RLE Coordination: WFL - gross/fine motor Left Lower Extremity Assessment LLE ROM/Strength/Tone: Within functional levels LLE Sensation: WFL - Light Touch LLE Coordination: WFL - gross/fine motor   Mobility Bed Mobility Bed Mobility: Rolling Right;Right Sidelying to Sit Rolling Right: 5: Supervision;With rail Right Sidelying to Sit: 4: Min assist;HOB flat Details for Bed Mobility Assistance: pt able to roll onto side with rail but then needed min verbal cues and min assist to help bring L LE all the way over to side of bed prior to sitting up. Transfers Transfers: Sit to Stand;Stand to Sit Sit to Stand: 5: Supervision;With upper extremity assist;From bed Stand to Sit: 5: Supervision;Without upper extremity assist;To bed Details for Transfer Assistance: Min/guard for standing with cues for hand placement on bed and to maintain upright posture.  Supervision for safety with sitting to recliner with cues and demonstration of maintaining back precautions.    Exercise    Balance    End of Session OT - End of Session Activity Tolerance: Patient tolerated treatment well Patient left: in bed;with call bell/phone within reach;with family/visitor present   [name] [phone] 01/30/2012, 12:03 PM

## 2012-01-30 NOTE — Evaluation (Signed)
Physical Therapy Evaluation Patient Details Name: [name] MRN: [id number] DOB: September 29, 1971 Today's Date: 01/30/2012 Time: 0923-1000 PT Time Calculation (min): 37 min  PT Assessment / Plan / Recommendation Clinical Impression  Pt presents s/p lumbar laminectomy at L5/S1 POD 1 with decreased mobility and increased pain.  Pt tolerated ambulation, however states that he feels weaker and has more pain today.  He also states that he walked around a lot last night with no problems.  Pt will benefit from skilled PT in acute venue to address deficits.  Pt will not require any follow up at D/C.      PT Assessment  Patient needs continued PT services    Follow Up Recommendations  No PT follow up    Equipment Recommendations  Cane    Frequency Min 5X/week    Precautions / Restrictions Precautions Precautions: Back Precaution Booklet Issued: Yes (comment) Precaution Comments: Educated on back precautions Restrictions Weight Bearing Restrictions: No   Pertinent Vitals/Pain 6/10 pain following ambulation      Mobility  Bed Mobility Bed Mobility: Rolling Right;Right Sidelying to Sit Rolling Right: 4: Min assist Right Sidelying to Sit: 4: Min assist;3: Mod assist Details for Bed Mobility Assistance: Some assist to get all the way on R side with cues for log rolling.  Min/Mod assist for trunk to attain sitting with cues for hand placement to push up from bed.  Transfers Transfers: Sit to Stand;Stand to Sit Sit to Stand: 4: Min guard;With upper extremity assist;From bed Stand to Sit: 5: Supervision;With armrests;With upper extremity assist;To chair/3-in-1 Details for Transfer Assistance: Min/guard for standing with cues for hand placement on bed and to maintain upright posture.  Supervision for safety with sitting to recliner with cues and demonstration of maintaining back precautions.  Ambulation/Gait Ambulation/Gait Assistance: 4: Min guard Ambulation Distance (Feet): 100  Feet (then another 100') Assistive device: Straight cane;Rolling walker (IV pole) Ambulation/Gait Assistance Details: Initially used IV pole for stability with noted imbalance and weakness in LE's when pt would get a "spasm."  Transitioned to RW and then cane with pt more stable with cane and preferred to use cane at home.  cues for sequencing/technique with ADs.  Gait Pattern: Step-through pattern Gait velocity: Decreased Stairs: No    Exercises     PT Goals Acute Rehab PT Goals PT Goal Formulation: With patient Time For Goal Achievement: 02/01/12 Potential to Achieve Goals: Good Pt will go Supine/Side to Sit: with modified independence PT Goal: Supine/Side to Sit - Progress: Goal set today Pt will go Sit to Supine/Side: with modified independence PT Goal: Sit to Supine/Side - Progress: Goal set today Pt will go Sit to Stand: with modified independence PT Goal: Sit to Stand - Progress: Goal set today Pt will go Stand to Sit: with modified independence PT Goal: Stand to Sit - Progress: Goal set today Pt will Ambulate: >150 feet;with modified independence PT Goal: Ambulate - Progress: Goal set today  Visit Information  Last PT Received On: 01/30/12 Assistance Needed: +1    Subjective Data  Subjective: "i'm doing better than I was" Patient Stated Goal: to get back home.   Prior Functioning  Home Living Lives With: Spouse Available Help at Discharge: Family Type of Home: House Home Access: Level entry Home Layout: One level Bathroom Shower/Tub: Engineer, manufacturing systems: Standard Home Adaptive Equipment: None Prior Function Level of Independence: Independent Able to Take Stairs?: Yes Driving: Yes Vocation: Full time employment Communication Communication: No difficulties    Cognition  Overall Cognitive Status: Appears within functional limits for tasks assessed/performed Arousal/Alertness: Awake/alert Orientation Level: Appears intact for tasks  assessed Behavior During Session: [facility] for tasks performed    Extremity/Trunk Assessment Right Lower Extremity Assessment RLE ROM/Strength/Tone: Within functional levels RLE Sensation: WFL - Light Touch RLE Coordination: WFL - gross/fine motor Left Lower Extremity Assessment LLE ROM/Strength/Tone: Within functional levels LLE Sensation: WFL - Light Touch LLE Coordination: WFL - gross/fine motor   Balance    End of Session PT - End of Session Activity Tolerance: Patient limited by pain Patient left: in chair;with call bell/phone within reach;with family/visitor present Nurse Communication: Mobility status   Page, Meribeth Mattes 01/30/2012, 10:32 AM

## 2012-01-30 NOTE — Progress Notes (Signed)
Subjective: 1 Day Post-Op Procedure(s) (LRB): DECOMPRESSIVE LUMBAR LAMINECTOMY LEVEL 1 (Bilateral) Patient reports pain as moderate.  Lot of pain in the back. Has already been up.  All the pain is int he back.  His legs are OK.  Taking the muscle relaxants.  Will try the soma he was on before.  Objective: Vital signs in last 24 hours: Temp:  [97.5 F (36.4 C)-98.9 F (37.2 C)] 98.8 F (37.1 C) (04/20 0520) Pulse Rate:  [57-96] 92  (04/20 0520) Resp:  [14-20] 20  (04/20 0520) BP: (112-140)/(51-88) 138/88 mmHg (04/20 0520) SpO2:  [93 %-100 %] 93 % (04/20 0520) Weight:  [108.863 kg (240 lb)] 108.863 kg (240 lb) (04/19 1622)  Intake/Output from previous day: 04/19 0701 - 04/20 0700 In: 3280 [P.O.:560; I.V.:2670; IV Piggyback:50] Out: 375 [Urine:275; Blood:100] Intake/Output this shift:    No results found for this basename: HGB:5 in the last 72 hours No results found for this basename: WBC:2,RBC:2,HCT:2,PLT:2 in the last 72 hours No results found for this basename: NA:2,K:2,CL:2,CO2:2,BUN:2,CREATININE:2,GLUCOSE:2,CALCIUM:2 in the last 72 hours No results found for this basename: LABPT:2,INR:2 in the last 72 hours  Neurovascular intact Sensation intact distally  Assessment/Plan: 1 Day Post-Op Procedure(s) (LRB): DECOMPRESSIVE LUMBAR LAMINECTOMY LEVEL 1 (Bilateral) Advance diet Up with therapy Change muscle relaxant  [name] 01/30/2012, 7:15 AM

## 2012-01-30 NOTE — Progress Notes (Signed)
Patient discharged to home with wife at bedside.  Back dressing CDI.  Denies pain and nausea.  Discussed discharge instructions.  Verbalized understanding.  Pain controlled with oral medications.  No other concerns at this time  Barrie Lyme 12:51 PM 01/30/2012

## 2012-02-01 NOTE — Discharge Summary (Signed)
Patient ID: [name] MRN: [id number] DOB/AGE: [birth year] 41 y.o.  Admit date: 01/29/2012 Discharge date: 02/01/2012  Admission Diagnoses:  HNP/spinal stenosis L5-S1 Past Medical History  Diagnosis Date  . HNP (herniated nucleus pulposus), lumbar    Discharge Diagnoses:  Same   Surgeries: Procedure(s): DECOMPRESSIVE LUMBAR LAMINECTOMY LEVEL 1 on 01/29/2012   Consultants:  PT/OT  Discharged Condition: Improved  Hospital Course: [name] is an 41 y.o. male who was admitted 01/29/2012 for operative treatment of HNP. Patient has severe unremitting pain that affects sleep, daily activities, and work/hobbies. After pre-op clearance the patient was taken to the operating room on 01/29/2012 and underwent  Procedure(s): DECOMPRESSIVE LUMBAR LAMINECTOMY LEVEL 1.    Patient was given perioperative antibiotics:  Anti-infectives     Start     Dose/Rate Route Frequency Ordered Stop   01/29/12 2000   ceFAZolin (ANCEF) IVPB 1 g/50 mL premix        1 g 100 mL/hr over 30 Minutes Intravenous Every 8 hours 01/29/12 1630 01/30/12 0441   01/29/12 1342   polymyxin B 500,000 Units, bacitracin 50,000 Units in sodium chloride irrigation 0.9 % 500 mL irrigation  Status:  Discontinued          As needed 01/29/12 1343 01/29/12 1536   01/29/12 1028   ceFAZolin (ANCEF) IVPB 2 g/50 mL premix        2 g 100 mL/hr over 30 Minutes Intravenous 60 min pre-op 01/29/12 1028 01/29/12 1245           Patient was given sequential compression devices, early ambulation, and chemoprophylaxis to prevent DVT.  Patient benefited maximally from hospital stay and there were no complications.    Recent vital signs:  Temp: [97.5 F (36.4 C)-98.9 F (37.2 C)] 98.8 F (37.1 C) (04/20 0520)  Pulse Rate: [57-96] 92 (04/20 0520)  Resp: [14-20] 20 (04/20 0520)  BP: (112-140)/(51-88) 138/88 mmHg (04/20 0520)  SpO2: [93 %-100 %] 93 % (04/20 0520)  Weight: [108.863 kg (240 lb)] 108.863 kg (240 lb)  (04/19 1622)      Recent laboratory studies: No results found for this basename: WBC:2,HGB:2,HCT:2,PLT:2,NA:2,K:2,CL:2,CO2:2,BUN:2,CREATININE:2,GLUCOSE:2,PT:2,INR:2,CALCIUM,2: in the last 72 hours   Discharge Medications:   Medication List  As of 02/01/2012 11:50 AM   TAKE these medications         carisoprodol 350 MG tablet   Commonly known as: SOMA   Take 1 tablet (350 mg total) by mouth 4 (four) times daily as needed.      HYDROcodone-acetaminophen 7.5-325 MG per tablet   Commonly known as: NORCO   Take 1 tablet by mouth every 4 (four) hours as needed. Pain            Diagnostic Studies: Dg Lumbar Spine 2-3 Views  01/19/2012  *RADIOLOGY REPORT*  Clinical Data: Preoperative evaluation.  History of low back pain.  LUMBAR SPINE - 2-3 VIEW  Comparison: None.  Findings: There is transitional anatomy.  There are only four non- rib bearing lumbar-type vertebral bodies labeled L1-L4.  For purposes of description the vertebral body incorporated into the sacrum is labeled as L5.  There is narrowing of the posterior intervertebral disc space at the level of L4-L5.  There is minimal osteophyte formation in the spine representing degenerative spondylosis.  No fracture or bony destruction is seen.  Alignment appears normal.  SI joints appear normal.  There is moderate fecal distention of portions of the colon.  IMPRESSION: A developmental transitional anatomy is present.  Only four non-rib  bearing lumbar-type vertebral bodies are present and are labeled L1- L4.  Please note this numbering if the patient should have surgery or procedure performed. Slight narrowing of intervertebral disc space posteriorly at L4-L5 level.  Degenerative spondylosis. Moderate fecal distention of portions of the colon.  Original Report Authenticated By: Crawford Givens, M.D.   Dg Spine Portable 1 View  01/29/2012  *RADIOLOGY REPORT*  Clinical Data: Back pain  PORTABLE SPINE - 1 VIEW  Comparison: Prior intraoperative portable  films today #1 and #2. Plain films 01/19/2012.  Findings: The L5-S1 disc space is hypoplastic.  A probe has been inserted in the L4-L5 interspace.  IMPRESSION: L4-L5 marked.  Original Report Authenticated By: Elsie Stain, M.D.   Dg Spine Portable 1 View  01/29/2012  *RADIOLOGY REPORT*  Clinical Data: Portable spine evaluation for decompression  PORTABLE SPINE - 1 VIEW  Comparison: Film #1 1330 hours.  Plain films of the lumbar spine dated 01/19/2012.  Findings: The needles noted on the previous study have been removed.  There is an angled probe overlying the hypoplastic L5-S1 disc space.  IMPRESSION: As above.r  Original Report Authenticated By: Elsie Stain, M.D.   Dg Spine Portable 1 View  01/29/2012  *RADIOLOGY REPORT*  Clinical Data: Lumbar disc disease.  PORTABLE SPINE - 1 VIEW  Comparison: Radiographs dated 01/19/2012  Findings: Needles at the L4-5 level as compared the labeling on the spine x-rays dated 01/19/2012.  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  IMPRESSION: Needles at L4-5 as compared to the prior exam of 01/19/2012.  Original Report Authenticated By: Gwynn Burly, M.D.    Disposition: 01-Home or Self Care  Discharge Orders    Future Orders Please Complete By Expires   Diet - low sodium heart healthy      Call MD / Call 911      Comments:   If you experience chest pain or shortness of breath, CALL 911 and be transported to the hospital emergency room.  If you develope a fever above 101 F, pus (white drainage) or increased drainage or redness at the wound, or calf pain, call your surgeon's office.   Constipation Prevention      Comments:   Drink plenty of fluids.  Prune juice may be helpful.  You may use a stool softener, such as Colace (over the counter) 100 mg twice a day.  Use MiraLax (over the counter) for constipation as needed.   Increase activity slowly as tolerated      Weight Bearing as taught in Physical Therapy       Comments:   Use a walker or crutches as instructed.   Discharge instructions      Comments:   Walk As Tolerated utilizing back precautions.  No bending, twisting, or lifting.  No driving for 2 weeks.  Ok to shower in 72 hours.  Use good body mechanics. Change dressing daily.      Follow-up Information    Follow up with BEANE,JEFFREY C, MD in 14 days.   Contact information:   [facility] [address] [place] [address] [phone]           Signed: Liam Graham. 02/01/2012, 11:50 AM

## 2012-02-20 ENCOUNTER — Emergency Department (HOSPITAL_COMMUNITY)
Admission: EM | Admit: 2012-02-20 | Discharge: 2012-02-20 | Disposition: A | Discharge status: Home / Self Care | Payer: Worker's Compensation | Attending: Emergency Medicine | Admitting: Emergency Medicine

## 2012-02-20 ENCOUNTER — Encounter (HOSPITAL_COMMUNITY): Payer: Self-pay | Admitting: *Deleted

## 2012-02-20 DIAGNOSIS — M5126 Other intervertebral disc displacement, lumbar region: Secondary | ICD-10-CM | POA: Insufficient documentation

## 2012-02-20 DIAGNOSIS — I808 Phlebitis and thrombophlebitis of other sites: Secondary | ICD-10-CM | POA: Insufficient documentation

## 2012-02-20 MED ORDER — NAPROXEN 500 MG PO TABS: 500.0000 mg | ORAL_TABLET | Freq: Two times a day (BID) | ORAL | Status: AC

## 2012-02-20 MED ORDER — CEPHALEXIN 500 MG PO CAPS: 1000.0000 mg | ORAL_CAPSULE | Freq: Two times a day (BID) | ORAL | Status: AC

## 2012-02-20 NOTE — Discharge Instructions (Signed)
See information below about phlebitis. Apply warm compresses to the area. Naprosyn for inflammation. Keflex for possible infection. Follow up as needed.   Phlebitis Phlebitis is a redness, tenderness and soreness (inflammation) in a vein. This can occur in your arms, legs, or torso (trunk), as well as deeper inside your body.  CAUSES  Phlebitis can be triggered by multiple factors. These include:  Reduced (restricted) blood flow through your veins. This happens with prolonged bed rest, long distance travel, injury or surgery. Being overweight (obese) and pregnant can also restrict blood flow and lead to phlebitis.   Putting a catheter in the vein (intravenous or IV) and giving certain medications through in the vein (intravenously).   Cancer and cancer treatment.   Use of illegal intravenous drugs.   Inflammatory diseases.   Inherited (genetic) diseases that increase the risk for blood clots.   Hormone therapy (such as birth control pills).  SYMPTOMS   Red, tender, swollen, painful area on your skin.   Usually, the area will be long and narrow.   Low grade fever.   Significant firmness along the center of this area. This can indicate that a blood clot has formed.   Surrounding redness or a high fever, which can indicate an infection (cellulitis).  DIAGNOSIS   The appearance of your condition and your symptoms will cause your caregiver to suspect phlebitis. Usually, this is enough for a diagnosis.   Your caregiver may request blood tests or an ultrasound test of the area to be sure you do not have an infection or a blood clot. Blood tests and discussing your family history may also indicate if you have an underlying genetic disease that causes blood clots.   Occasionally, a piece of tissue is taken from the body (biopsy) if an unusual cause of phlebitis is suspected.  TREATMENT   Raise (elevate) the affected area above the level of the heart.   Apply a warm compress or  heating pad for 20 minutes, 3 or 4 times a day. If you use an electric heating pad, follow the directions so you do not burn yourself.   Anti-inflammatory medications are usually recommended. Follow your caregiver's directions.   Any IV catheter, if present, will be removed by your caregiver.   Your caregiver may prescribe medicines that kill germs (antibiotics) if an infection is present.   Your caregiver may recommend blood thinners if a blood clot is suspected or present.   Support stockings or bandages may be helpful, depending on the cause and location of the phlebitis.   Surgery may be needed to remove very damaged sections of vein, but this is rare.  HOME CARE INSTRUCTIONS   Take medications exactly as prescribed.   Follow up with your caregiver as directed.   Use support stockings or bandages if advised. These will speed healing and prevent recurrence.   If you are on blood thinners:   Do follow-up blood tests exactly as directed.   Check with your caregiver before using any new medications.   Wear a pendant to show that you are on blood thinners.   For phlebitis in the legs:   Avoid prolonged standing or bed rest.   Keep your legs moving. Raise your legs with sitting or lying.   Do not smoke.   Women, particularly those over the age of 63, should consider the risks and benefits of taking the contraceptive pill. This kind of hormone treatment can increase your risk for blood clots.  SEEK MEDICAL  CARE IF:   You have unusual bruising or any bleeding problems.   Swelling or pain in your affected arm or leg is not gradually improving.   You are on anti-inflammatory medication and you develop belly (abdominal) pain.  SEEK IMMEDIATE MEDICAL CARE IF:   An unexplained oral temperature above 100.5 F (38.1 C) develops.   You have sudden onset of chest pain or difficulty breathing.  Document Released: 09/22/2001 Document Revised: 09/17/2011 Document Reviewed:  06/24/2009 [place] Patient Information 2012 [place], [place].

## 2012-02-20 NOTE — ED Provider Notes (Signed)
History     CSN: [id number]  Arrival date & time 02/20/12  4098   First MD Initiated Contact with Patient 02/20/12 1908      Chief Complaint  Patient presents with  . Numbness    Left hand    (Consider location/radiation/quality/duration/timing/severity/associated sxs/prior treatment) Patient is a 41 y.o. male presenting with wrist pain. The history is provided by the patient.  Wrist Pain This is a new problem. The current episode started in the past 7 days. The problem occurs constantly. The problem has been unchanged. Pertinent negatives include no arthralgias, chills, fever, headaches, joint swelling, myalgias, nausea, numbness, rash, vomiting or weakness. Exacerbated by: touch.  Pt states his vein in left wrist is hard, tender. Denies redness, swelling, fever, chills. States recently had surgery, 3 wks ago, and had IV in that same exact vein. No swelling in the hand. No CP, no SOB.  Past Medical History  Diagnosis Date  . HNP (herniated nucleus pulposus), lumbar     Past Surgical History  Procedure Date  . Hernia repair 2009  . Refractive surgery   . Back surgery     History reviewed. No pertinent family history.  History  Substance Use Topics  . Smoking status: Never Smoker   . Smokeless tobacco: Current User    Types: Chew  . Alcohol Use: Yes     occ      Review of Systems  Constitutional: Negative for fever and chills.  Eyes: Negative.   Respiratory: Negative.   Cardiovascular: Negative.   Gastrointestinal: Negative for nausea and vomiting.  Musculoskeletal: Negative for myalgias, joint swelling and arthralgias.  Skin: Negative for color change and rash.  Neurological: Negative for dizziness, weakness, numbness and headaches.    Allergies  Review of patient's allergies indicates no known allergies.  Home Medications   Current Outpatient Rx  Name Route Sig Dispense Refill  . CARISOPRODOL 350 MG PO TABS Oral Take 350 mg by mouth 4 (four) times  daily as needed. For back pain relief    . CETIRIZINE HCL 10 MG PO TABS Oral Take 10 mg by mouth daily.    . IBUPROFEN 400 MG PO TABS Oral Take 400 mg by mouth every 6 (six) hours as needed. For pain relief    . HYDROCODONE-ACETAMINOPHEN 7.5-325 MG PO TABS Oral Take 1 tablet by mouth every 4 (four) hours as needed. Pain 60 tablet 1    BP 133/97  Pulse 90  Temp(Src) 98.2 F (36.8 C) (Oral)  Resp 18  SpO2 99%  Physical Exam  Nursing note and vitals reviewed. Constitutional: He is oriented to person, place, and time. He appears well-developed and well-nourished. No distress.  HENT:  Head: Normocephalic.  Eyes: Conjunctivae are normal.  Neck: Neck supple.  Cardiovascular: Normal rate, regular rhythm and normal heart sounds.   Pulmonary/Chest: Effort normal and breath sounds normal. No respiratory distress. He has no wheezes.  Musculoskeletal: Normal range of motion.       Normal appearing left hand and wrist. There is a hardening and tenderness over a vein located on left radial aspect of the wrist. NO redness over the area  Neurological: He is alert and oriented to person, place, and time.  Skin: Skin is warm and dry.  Psychiatric: He has a normal mood and affect.    ED Course  Procedures (including critical care time)  Exam consistent with phlebitis. Probably from recent IV in that area. No signs of infection, however that is pt's concern. Will  start on short course of keflex, NSAIDs, warm compresses. Pt reassured. No hand swelling, doubt DVT.  1. Superficial phlebitis of arm       MDM          Lottie Mussel, PA 02/21/12 0120

## 2012-02-20 NOTE — ED Notes (Signed)
Pt from home with reports of left hand numbness/tingling around thumb area that started about 2 hours ago, pt endorses back surgery about 3 weeks ago but denies shortness of breath or pain.

## 2012-02-21 NOTE — ED Provider Notes (Signed)
Medical screening examination/treatment/procedure(s) were performed by non-physician practitioner and as supervising physician I was immediately available for consultation/collaboration. Devoria Albe, MD, FACEP   Ward Givens, MD 02/21/12 1049

## 2013-01-21 IMAGING — CR DG SPINE 1V PORT
1 series · 1 of 1 positions shown · non-contrast
Comparison: Radiographs dated 01/19/2012

CLINICAL DATA: Lumbar disc disease.

PORTABLE SPINE - 1 VIEW

[lateral]
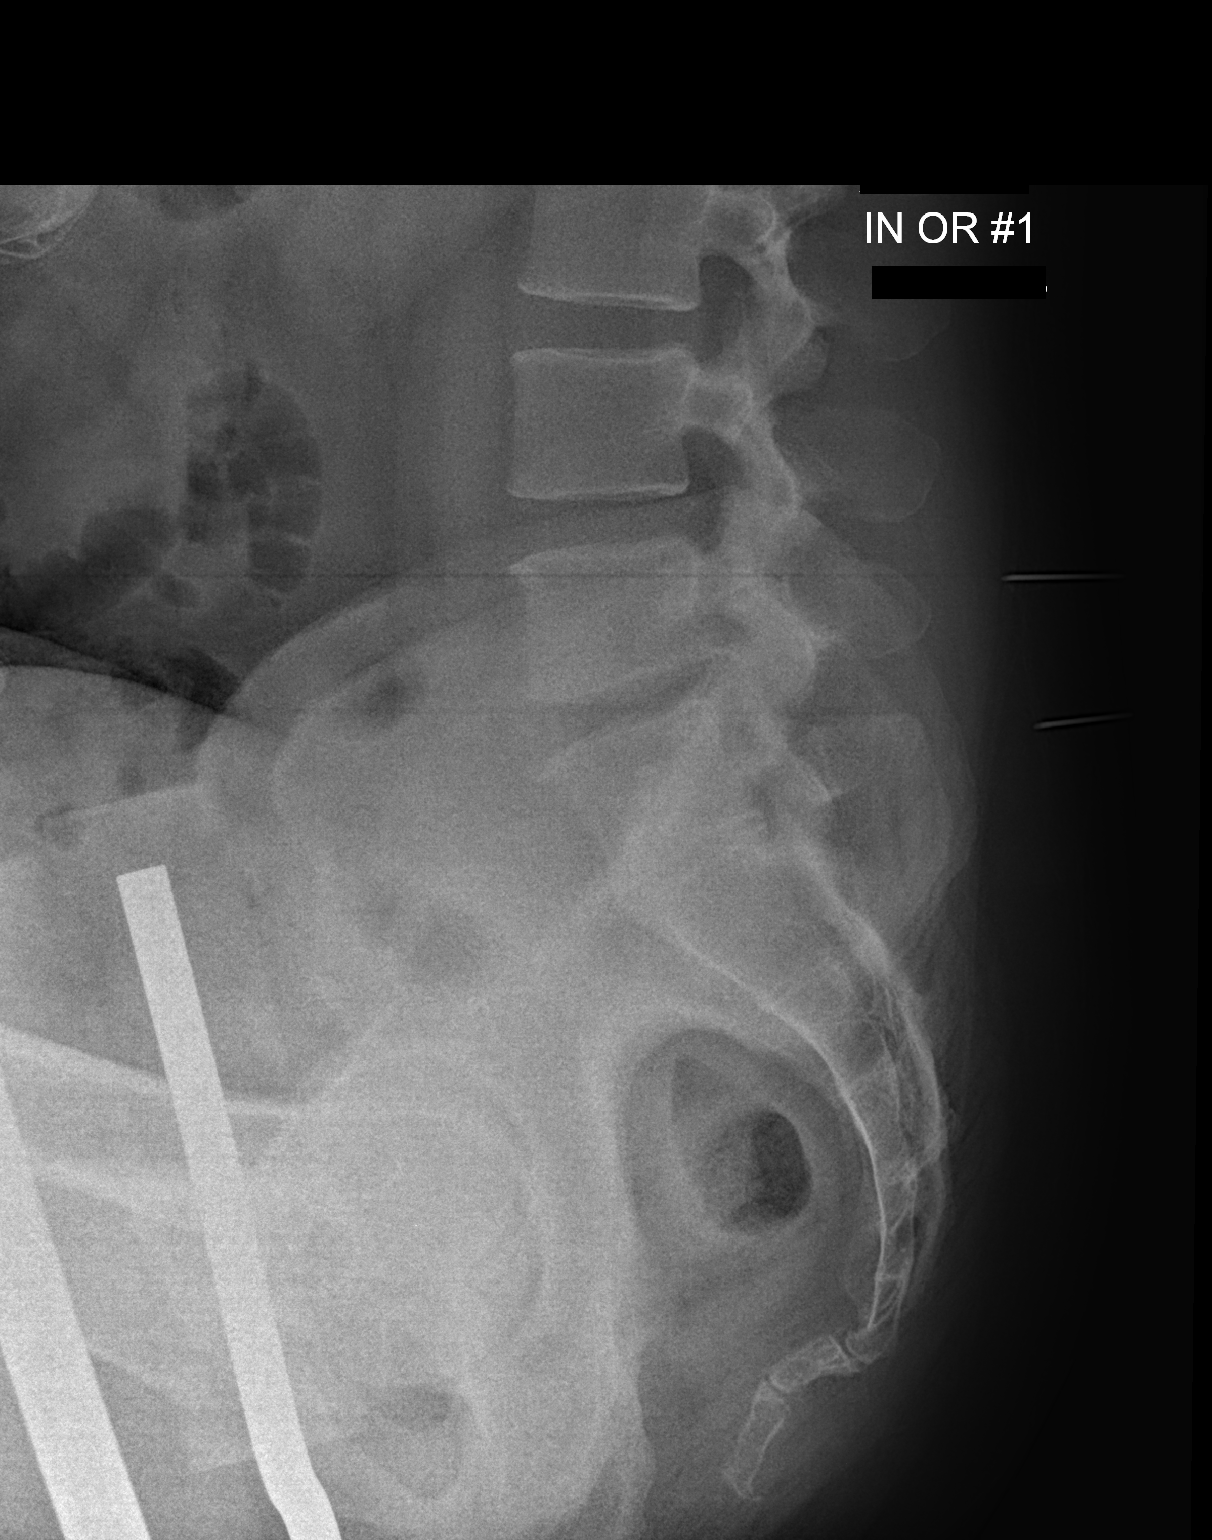

[1 of 1 positions shown; findings below may reference images not displayed]

FINDINGS: Needles at the L4-5 level as compared the labeling on the
spine x-rays dated 01/19/2012.  Since the last open interspace
since L5 is congenitally fused with the sacrum.
IMPRESSION: Needles at L4-5 as compared to the prior exam of 01/19/2012.

## 2013-01-21 IMAGING — CR DG SPINE 1V PORT
1 series · 1 of 1 positions shown · non-contrast
Comparison: Prior intraoperative portable films today #1 and #2.
Plain films 01/19/2012.

CLINICAL DATA: Back pain

PORTABLE SPINE - 1 VIEW

[lateral]
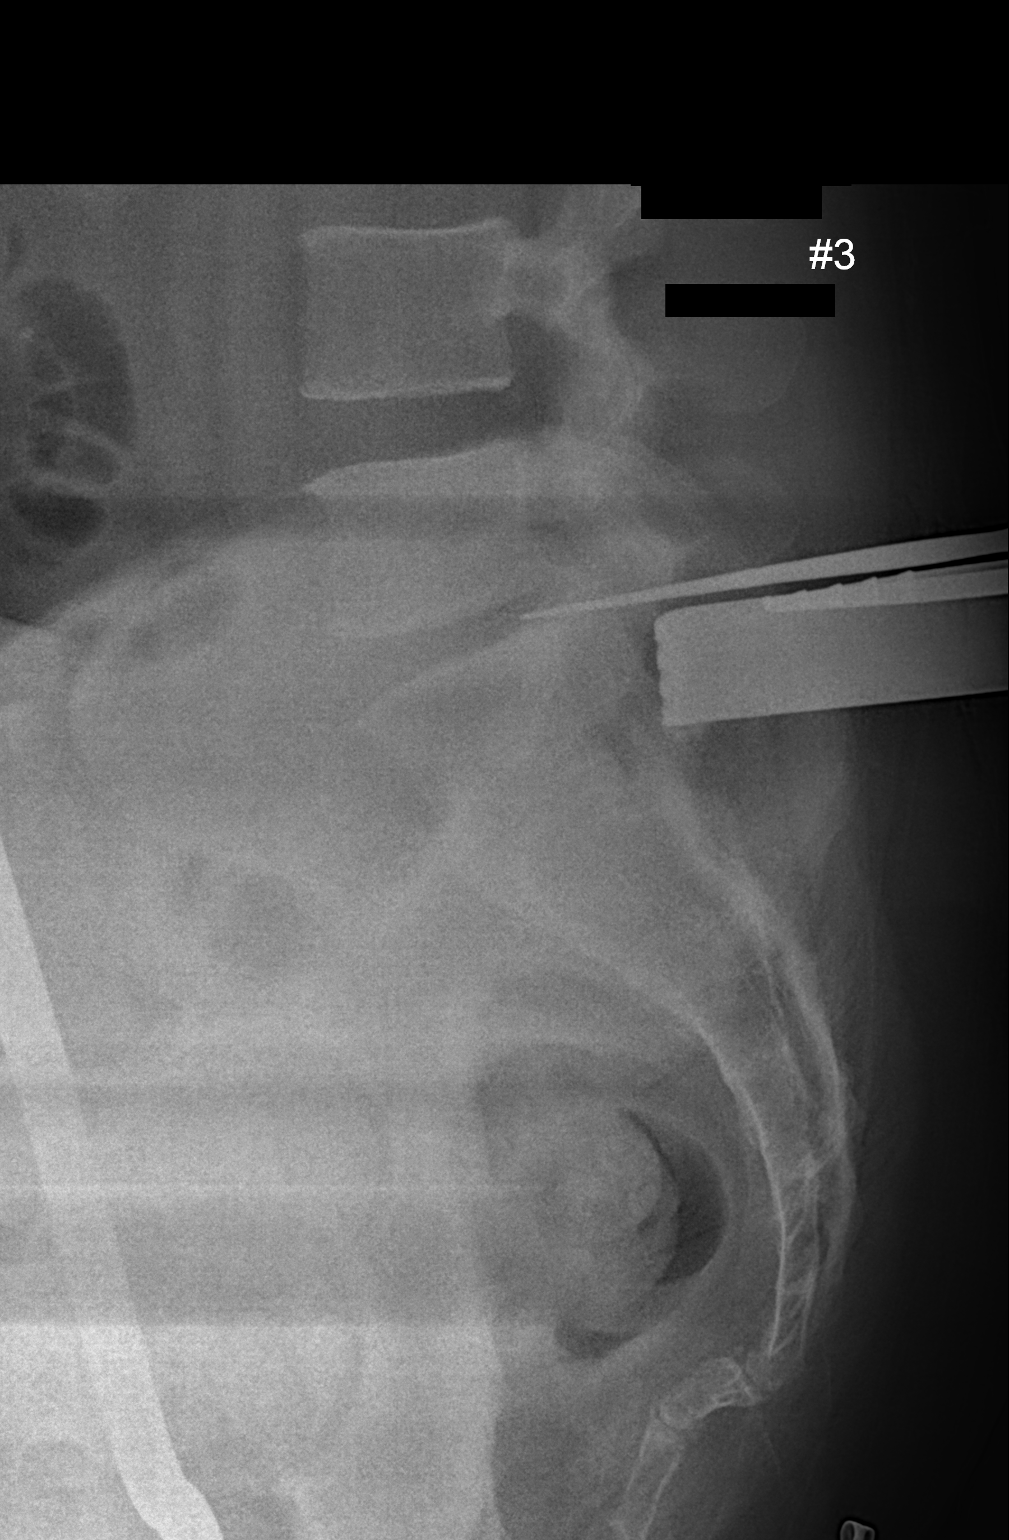

[1 of 1 positions shown; findings below may reference images not displayed]

FINDINGS: The L5-S1 disc space is hypoplastic.  A probe has been
inserted in the L4-L5 interspace.
IMPRESSION: L4-L5 marked.

## 2016-05-24 ENCOUNTER — Emergency Department (HOSPITAL_COMMUNITY): Payer: BLUE CROSS/BLUE SHIELD

## 2016-05-24 ENCOUNTER — Inpatient Hospital Stay (HOSPITAL_COMMUNITY): Payer: BLUE CROSS/BLUE SHIELD

## 2016-05-24 ENCOUNTER — Inpatient Hospital Stay (HOSPITAL_COMMUNITY)
Admission: EM | Admit: 2016-05-24 | Discharge: 2016-05-31 | DRG: 264 | Disposition: A | Discharge status: Home / Self Care | Payer: BLUE CROSS/BLUE SHIELD | Attending: Internal Medicine | Admitting: Internal Medicine

## 2016-05-24 ENCOUNTER — Encounter (HOSPITAL_COMMUNITY): Payer: Self-pay | Admitting: Emergency Medicine

## 2016-05-24 DIAGNOSIS — N189 Chronic kidney disease, unspecified: Secondary | ICD-10-CM | POA: Diagnosis not present

## 2016-05-24 DIAGNOSIS — I161 Hypertensive emergency: Principal | ICD-10-CM | POA: Diagnosis present

## 2016-05-24 DIAGNOSIS — N179 Acute kidney failure, unspecified: Secondary | ICD-10-CM | POA: Diagnosis not present

## 2016-05-24 DIAGNOSIS — R809 Proteinuria, unspecified: Secondary | ICD-10-CM | POA: Diagnosis present

## 2016-05-24 DIAGNOSIS — R7989 Other specified abnormal findings of blood chemistry: Secondary | ICD-10-CM

## 2016-05-24 DIAGNOSIS — Z8249 Family history of ischemic heart disease and other diseases of the circulatory system: Secondary | ICD-10-CM | POA: Diagnosis not present

## 2016-05-24 DIAGNOSIS — N184 Chronic kidney disease, stage 4 (severe): Secondary | ICD-10-CM | POA: Diagnosis not present

## 2016-05-24 DIAGNOSIS — R519 Headache, unspecified: Secondary | ICD-10-CM | POA: Diagnosis present

## 2016-05-24 DIAGNOSIS — E669 Obesity, unspecified: Secondary | ICD-10-CM | POA: Diagnosis present

## 2016-05-24 DIAGNOSIS — I10 Essential (primary) hypertension: Secondary | ICD-10-CM | POA: Diagnosis not present

## 2016-05-24 DIAGNOSIS — Z6834 Body mass index (BMI) 34.0-34.9, adult: Secondary | ICD-10-CM | POA: Diagnosis not present

## 2016-05-24 DIAGNOSIS — N19 Unspecified kidney failure: Secondary | ICD-10-CM

## 2016-05-24 DIAGNOSIS — R51 Headache: Secondary | ICD-10-CM

## 2016-05-24 DIAGNOSIS — E8770 Fluid overload, unspecified: Secondary | ICD-10-CM | POA: Diagnosis present

## 2016-05-24 DIAGNOSIS — D649 Anemia, unspecified: Secondary | ICD-10-CM | POA: Diagnosis not present

## 2016-05-24 DIAGNOSIS — F17220 Nicotine dependence, chewing tobacco, uncomplicated: Secondary | ICD-10-CM | POA: Diagnosis present

## 2016-05-24 DIAGNOSIS — I129 Hypertensive chronic kidney disease with stage 1 through stage 4 chronic kidney disease, or unspecified chronic kidney disease: Secondary | ICD-10-CM | POA: Diagnosis present

## 2016-05-24 DIAGNOSIS — I160 Hypertensive urgency: Secondary | ICD-10-CM

## 2016-05-24 DIAGNOSIS — D638 Anemia in other chronic diseases classified elsewhere: Secondary | ICD-10-CM | POA: Diagnosis present

## 2016-05-24 HISTORY — DX: Essential (primary) hypertension: I10

## 2016-05-24 HISTORY — DX: Disorder of kidney and ureter, unspecified: N28.9

## 2016-05-24 LAB — BASIC METABOLIC PANEL
ANION GAP: 11 (ref 5–15)
BUN: 57 mg/dL — ABNORMAL HIGH (ref 6–20)
CHLORIDE: 107 mmol/L (ref 101–111)
CO2: 20 mmol/L — ABNORMAL LOW (ref 22–32)
Calcium: 8.3 mg/dL — ABNORMAL LOW (ref 8.9–10.3)
Creatinine, Ser: 9.3 mg/dL — ABNORMAL HIGH (ref 0.61–1.24)
GFR, EST AFRICAN AMERICAN: 7 mL/min — AB (ref >60)
GFR, EST NON AFRICAN AMERICAN: 6 mL/min — AB (ref >60)
Glucose, Bld: 92 mg/dL (ref 65–99)
POTASSIUM: 5.1 mmol/L (ref 3.5–5.1)
SODIUM: 138 mmol/L (ref 135–145)

## 2016-05-24 LAB — BRAIN NATRIURETIC PEPTIDE: B Natriuretic Peptide: 108.2 pg/mL — ABNORMAL HIGH (ref 0.0–100.0)

## 2016-05-24 LAB — CBC
HEMATOCRIT: 31.7 % — AB (ref 39.0–52.0)
HEMATOCRIT: 33.3 % — AB (ref 39.0–52.0)
HEMOGLOBIN: 11.4 g/dL — AB (ref 13.0–17.0)
Hemoglobin: 11.6 g/dL — ABNORMAL LOW (ref 13.0–17.0)
MCH: 29.6 pg (ref 26.0–34.0)
MCH: 29.1 pg (ref 26.0–34.0)
MCHC: 36 g/dL (ref 30.0–36.0)
MCHC: 34.8 g/dL (ref 30.0–36.0)
MCV: 82.3 fL (ref 78.0–100.0)
MCV: 83.5 fL (ref 78.0–100.0)
PLATELETS: 252 10*3/uL (ref 150–400)
Platelets: 250 10*3/uL (ref 150–400)
RBC: 3.85 MIL/uL — ABNORMAL LOW (ref 4.22–5.81)
RBC: 3.99 MIL/uL — AB (ref 4.22–5.81)
RDW: 12.7 % (ref 11.5–15.5)
RDW: 12.5 % (ref 11.5–15.5)
WBC: 7.8 10*3/uL (ref 4.0–10.5)
WBC: 8.4 10*3/uL (ref 4.0–10.5)

## 2016-05-24 LAB — URINALYSIS, ROUTINE W REFLEX MICROSCOPIC
Bilirubin Urine: NEGATIVE
GLUCOSE, UA: NEGATIVE mg/dL
Ketones, ur: NEGATIVE mg/dL
LEUKOCYTES UA: NEGATIVE
NITRITE: NEGATIVE
PH: 5.5 (ref 5.0–8.0)
Protein, ur: >300 mg/dL — AB
SPECIFIC GRAVITY, URINE: 1.015 (ref 1.005–1.030)

## 2016-05-24 LAB — URINE MICROSCOPIC-ADD ON

## 2016-05-24 LAB — MRSA PCR SCREENING: MRSA by PCR: NEGATIVE

## 2016-05-24 LAB — I-STAT TROPONIN, ED: TROPONIN I, POC: 0.01 ng/mL (ref 0.00–0.08)

## 2016-05-24 MED ORDER — HYDROCODONE-ACETAMINOPHEN 5-325 MG PO TABS
1.0000 | ORAL_TABLET | ORAL | Status: DC | PRN
  Administered 2016-05-24 – 2016-05-25 (×5): 1 via ORAL
  Filled 2016-05-24 (×6): qty 1

## 2016-05-24 MED ORDER — HYDROMORPHONE HCL 1 MG/ML IJ SOLN
1.0000 mg | Freq: Once | INTRAMUSCULAR | Status: AC
  Administered 2016-05-24: 1 mg via INTRAVENOUS
  Filled 2016-05-24: qty 1

## 2016-05-24 MED ORDER — ONDANSETRON HCL 4 MG/2ML IJ SOLN: 4.0000 mg | Freq: Four times a day (QID) | INTRAMUSCULAR | Status: DC | PRN

## 2016-05-24 MED ORDER — PANTOPRAZOLE SODIUM 40 MG PO TBEC
40.0000 mg | DELAYED_RELEASE_TABLET | Freq: Every day | ORAL | Status: DC
  Administered 2016-05-25 – 2016-05-31 (×6): 40 mg via ORAL
  Filled 2016-05-24 (×6): qty 1

## 2016-05-24 MED ORDER — HYDRALAZINE HCL 20 MG/ML IJ SOLN
5.0000 mg | Freq: Three times a day (TID) | INTRAMUSCULAR | Status: DC | PRN
  Administered 2016-05-24: 10 mg via INTRAVENOUS
  Filled 2016-05-24: qty 1

## 2016-05-24 MED ORDER — SODIUM CHLORIDE 0.9% FLUSH
3.0000 mL | Freq: Two times a day (BID) | INTRAVENOUS | Status: DC
  Administered 2016-05-24 – 2016-05-31 (×13): 3 mL via INTRAVENOUS

## 2016-05-24 MED ORDER — LABETALOL HCL 5 MG/ML IV SOLN
10.0000 mg | Freq: Once | INTRAVENOUS | Status: AC
  Administered 2016-05-24: 10 mg via INTRAVENOUS
  Filled 2016-05-24: qty 4

## 2016-05-24 MED ORDER — AMLODIPINE BESYLATE 5 MG PO TABS
5.0000 mg | ORAL_TABLET | Freq: Once | ORAL | Status: AC
  Administered 2016-05-24: 5 mg via ORAL
  Filled 2016-05-24: qty 1

## 2016-05-24 MED ORDER — ONDANSETRON HCL 4 MG PO TABS: 4.0000 mg | ORAL_TABLET | Freq: Four times a day (QID) | ORAL | Status: DC | PRN

## 2016-05-24 MED ORDER — NICARDIPINE HCL IN NACL 20-0.86 MG/200ML-% IV SOLN
3.0000 mg/h | Freq: Once | INTRAVENOUS | Status: DC
  Filled 2016-05-24: qty 200

## 2016-05-24 MED ORDER — HYDRALAZINE HCL 25 MG PO TABS
25.0000 mg | ORAL_TABLET | Freq: Three times a day (TID) | ORAL | Status: DC
  Administered 2016-05-25 – 2016-05-31 (×20): 25 mg via ORAL
  Filled 2016-05-24 (×20): qty 1

## 2016-05-24 MED ORDER — LABETALOL HCL 5 MG/ML IV SOLN: 10.0000 mg | Freq: Once | INTRAVENOUS | Status: DC

## 2016-05-24 MED ORDER — ENOXAPARIN SODIUM 30 MG/0.3ML ~~LOC~~ SOLN
30.0000 mg | SUBCUTANEOUS | Status: DC
  Administered 2016-05-24 – 2016-05-26 (×3): 30 mg via SUBCUTANEOUS
  Filled 2016-05-24 (×3): qty 0.3

## 2016-05-24 MED ORDER — ONDANSETRON HCL 4 MG/2ML IJ SOLN
4.0000 mg | Freq: Four times a day (QID) | INTRAMUSCULAR | Status: DC | PRN
  Administered 2016-05-24: 4 mg via INTRAMUSCULAR
  Filled 2016-05-24: qty 2

## 2016-05-24 MED ORDER — LABETALOL HCL 200 MG PO TABS
300.0000 mg | ORAL_TABLET | Freq: Three times a day (TID) | ORAL | Status: DC
  Administered 2016-05-24 – 2016-05-31 (×19): 300 mg via ORAL
  Filled 2016-05-24 (×6): qty 2
  Filled 2016-05-24: qty 1
  Filled 2016-05-24 (×6): qty 2
  Filled 2016-05-24: qty 1
  Filled 2016-05-24 (×5): qty 2

## 2016-05-24 MED ORDER — FUROSEMIDE 10 MG/ML IJ SOLN
80.0000 mg | Freq: Three times a day (TID) | INTRAMUSCULAR | Status: DC
  Administered 2016-05-25 – 2016-05-26 (×5): 80 mg via INTRAVENOUS
  Filled 2016-05-24 (×5): qty 8

## 2016-05-24 MED ORDER — AMLODIPINE BESYLATE 10 MG PO TABS
10.0000 mg | ORAL_TABLET | Freq: Every day | ORAL | Status: DC
  Administered 2016-05-25 – 2016-05-31 (×6): 10 mg via ORAL
  Filled 2016-05-24 (×6): qty 1

## 2016-05-24 MED ORDER — SODIUM CHLORIDE 0.9 % IV SOLN
Freq: Once | INTRAVENOUS | Status: AC
  Administered 2016-05-24: 18:00:00 via INTRAVENOUS

## 2016-05-24 NOTE — ED Provider Notes (Signed)
[facility]-EMERGENCY DEPT Provider Note   CSN: [id number] Arrival date & time: 05/24/16  1508  First Provider Contact:  None       History   Chief Complaint Chief Complaint  Patient presents with  . Headache    with Hypertension    HPI [name] is a 45 y.o. male.  HPI 45 year old male with past medical history of hypertension and CK D stage II who presents with worsening hypertension and headache. The patient states that over the last month he has had progressively worsening hypertension. He saw his primary care doctor for this who changed several of his medications. However, there was a miscommunication and the patient actually stopped taking his blood pressure medications that he was supposed to take in addition to the additional meds that were prescribed. Over the last several days he has noticed a progressively worsening generalized, throbbing, aching headache. He denies any trauma. Denies any vision changes. Denies any associated chest pain. He has had some diffuse upper and lower externally peripheral edema over the last week as well. His blood pressure has been running in the 180s to 200 systolic at home, so he presented for evaluation. He has taken multiple doses of ibuprofen without any improvement in his headache. He has noticed that his urine is appeared darker over the same time.  Past Medical History:  Diagnosis Date  . HNP (herniated nucleus pulposus), lumbar   . Hypertension   . Kidney problem     Patient Active Problem List   Diagnosis Date Noted  . Hypertensive emergency 05/24/2016  . AKI (acute kidney injury) (HCC) 05/24/2016  . Anemia 05/24/2016  . Headache 05/24/2016    Past Surgical History:  Procedure Laterality Date  . BACK SURGERY    . HERNIA REPAIR  2009  . REFRACTIVE SURGERY         Home Medications    Prior to Admission medications   Medication Sig Start Date End Date Taking? Authorizing Provider  amLODipine (NORVASC) 5 MG tablet  Take 5 mg by mouth daily.   Yes Historical Provider, MD  aspirin 325 MG tablet Take 650 mg by mouth every 4 (four) hours as needed for moderate pain or headache.   Yes Historical Provider, MD  ibuprofen (ADVIL,MOTRIN) 200 MG tablet Take 400 mg by mouth every 6 (six) hours as needed for headache or moderate pain.   Yes Historical Provider, MD  losartan-hydrochlorothiazide (HYZAAR) 100-25 MG tablet Take 1 tablet by mouth daily.   Yes Historical Provider, MD  pseudoephedrine (SUDAFED) 30 MG tablet Take 60 mg by mouth every 4 (four) hours as needed for congestion.   Yes Historical Provider, MD    Family History Family History  Problem Relation Age of Onset  . Hypertension Mother   . Diabetes Mother   . COPD Father   . Hypertension Brother     Social History Social History  Substance Use Topics  . Smoking status: Never Smoker  . Smokeless tobacco: Current User    Types: Chew  . Alcohol use Yes     Comment: occ     Allergies   Review of patient's allergies indicates no known allergies.   Review of Systems Review of Systems  Constitutional: Positive for fatigue. Negative for chills and fever.  HENT: Negative for congestion and rhinorrhea.   Eyes: Negative for visual disturbance.  Respiratory: Negative for cough, shortness of breath and wheezing.   Cardiovascular: Negative for chest pain and leg swelling.  Gastrointestinal: Negative for abdominal  pain, diarrhea, nausea and vomiting.  Genitourinary: Negative for dysuria and flank pain.  Musculoskeletal: Negative for neck pain and neck stiffness.  Skin: Negative for rash and wound.  Allergic/Immunologic: Negative for immunocompromised state.  Neurological: Positive for headaches. Negative for syncope and weakness.     Physical Exam Updated Vital Signs BP (!) 174/104   Pulse 83   Temp 97.8 F (36.6 C) (Oral)   Resp 13   Ht 6\' 1"  (1.854 m)   Wt 267 lb (121.1 kg)   SpO2 99%   BMI 35.23 kg/m   Physical Exam    Constitutional: He is oriented to person, place, and time. He appears well-developed and well-nourished. No distress.  HENT:  Head: Normocephalic and atraumatic.  Mouth/Throat: Oropharynx is clear and moist.  Eyes: Conjunctivae are normal. Pupils are equal, round, and reactive to light.  Neck: Neck supple.  Cardiovascular: Normal rate, regular rhythm and normal heart sounds.  Exam reveals no friction rub.   No murmur heard. Pulmonary/Chest: Effort normal and breath sounds normal. No respiratory distress. He has no wheezes. He has no rales.  Abdominal: He exhibits no distension. There is no tenderness.  Musculoskeletal: He exhibits edema (Mild, nonpitting edema of upper and lower extremities bilaterally).  Neurological: He is alert and oriented to person, place, and time. He exhibits normal muscle tone.  Skin: Skin is warm. Capillary refill takes less than 2 seconds.  Nursing note and vitals reviewed.   Neurological Exam:  Mental Status: Alert and oriented to person, place, and time. Attention and concentration normal. Speech clear. Recent memory is intact. Cranial Nerves: Visual fields intact to confrontation in all quadrants bilaterally. EOMI and PERRLA. No nystagmus noted. Facial sensation intact at forehead, maxillary cheek, and chin/mandible bilaterally. No weakness of masticatory muscles. No facial asymmetry or weakness. Hearing grossly normal to finer rub. Uvula is midline, and palate elevates symmetrically. Normal SCM and trapezius strength. Tongue midline without fasciculations Motor: Muscle strength 5/5 in proximal and distal UE and LE bilaterally. No pronator drift. Muscle tone normal. Reflexes: 2+ and symmetrical in all four extremities.  Sensation: Intact to light touch in upper and lower extremities distally bilaterally.  Gait: Normal without ataxia. Coordination: Normal FTN bilaterally.    ED Treatments / Results  Labs (all labs ordered are listed, but only abnormal  results are displayed) Labs Reviewed  BASIC METABOLIC PANEL - Abnormal; Notable for the following:       Result Value   CO2 20 (*)    BUN 57 (*)    Creatinine, Ser 9.30 (*)    Calcium 8.3 (*)    GFR calc non Af Amer 6 (*)    GFR calc Af Amer 7 (*)    All other components within normal limits  CBC - Abnormal; Notable for the following:    RBC 3.85 (*)    Hemoglobin 11.4 (*)    HCT 31.7 (*)    All other components within normal limits  BRAIN NATRIURETIC PEPTIDE - Abnormal; Notable for the following:    B Natriuretic Peptide 108.2 (*)    All other components within normal limits  URINALYSIS, ROUTINE W REFLEX MICROSCOPIC (NOT AT [facility]) - Abnormal; Notable for the following:    APPearance HAZY (*)    Hgb urine dipstick LARGE (*)    Protein, ur >300 (*)    All other components within normal limits  URINE MICROSCOPIC-ADD ON - Abnormal; Notable for the following:    Squamous Epithelial / LPF 0-5 (*)  Bacteria, UA RARE (*)    Casts WBC CAST (*)    All other components within normal limits  CBC - Abnormal; Notable for the following:    RBC 3.99 (*)    Hemoglobin 11.6 (*)    HCT 33.3 (*)    All other components within normal limits  MRSA PCR SCREENING  PROTEIN / CREATININE RATIO, URINE  SODIUM, URINE, RANDOM  CBC  COMPREHENSIVE METABOLIC PANEL  ANTINUCLEAR ANTIBODIES, IFA  RHEUMATOID FACTOR  COMPLEMENT, TOTAL  KAPPA/LAMBDA LIGHT CHAINS  PROTEIN ELECTROPHORESIS, SERUM  MPO/PR-3 (ANCA) ANTIBODIES  C3 COMPLEMENT  C4 COMPLEMENT  GLOMERULAR BASEMENT MEMBRANE ANTIBODIES  ANTISTREPTOLYSIN O TITER  HEPATITIS PANEL, ACUTE  HIV ANTIBODY (ROUTINE TESTING)  LIPID PANEL  I-STAT TROPOININ, ED    EKG  EKG Interpretation  Date/Time:  Sunday May 24 2016 16:07:11 EDT Ventricular Rate:  74 PR Interval:    QRS Duration: 96 QT Interval:  395 QTC Calculation: 439 R Axis:   72 Text Interpretation:  Sinus rhythm TWI in lead III No old tracing to compare No acute ST changes Confirmed  by Erma Heritage MD, Merwin Breden [phone]) on 05/25/2016 1:04:35 AM       Radiology Dg Chest 2 View  Result Date: 05/24/2016 CLINICAL DATA:  Worsening hypertension. EXAM: CHEST  2 VIEW COMPARISON:  None. FINDINGS: Normal heart size. Normal mediastinal contour. No pneumothorax. No pleural effusion. Lungs appear clear, with no acute consolidative airspace disease and no pulmonary edema. IMPRESSION: No active cardiopulmonary disease. Electronically Signed   By: Delbert Phenix M.D.   On: 05/24/2016 15:45   Ct Head Wo Contrast  Result Date: 05/24/2016 CLINICAL DATA:  45 year old male with severe headaches for 5 days and elevated blood pressure. Initial encounter. EXAM: CT HEAD WITHOUT CONTRAST TECHNIQUE: Contiguous axial images were obtained from the base of the skull through the vertex without intravenous contrast. COMPARISON:  None. FINDINGS: Visualized paranasal sinuses and mastoids are well pneumatized. No osseous abnormality identified. Visualized orbits and scalp soft tissues are within normal limits. No midline shift, ventriculomegaly, mass effect, evidence of mass lesion, intracranial hemorrhage or evidence of cortically based acute infarction. Gray-white matter differentiation is within normal limits throughout the brain. No suspicious intracranial vascular hyperdensity. IMPRESSION: Normal noncontrast CT appearance of the brain. Electronically Signed   By: Odessa Fleming M.D.   On: 05/24/2016 15:46   US Renal  Result Date: 05/24/2016 CLINICAL DATA:  Acute kidney injury.  History of hypertension. EXAM: RENAL / URINARY TRACT ULTRASOUND COMPLETE COMPARISON:  None. FINDINGS: Right Kidney: Length: 9.3 cm. Simple appearing cystic lesions demonstrated in the right kidney. Upper pole lesion measures 1.8 cm maximal diameter. Lower pole lesion measures 4 cm maximal diameter. No solid mass lesions identified. Renal parenchymal echotexture appears diffusely increased. Left Kidney: Length: 12.3 cm. Diffusely increased renal  parenchymal echotexture. No solid mass or hydronephrosis identified. Bladder: No bladder wall thickening or filling defects. IMPRESSION: Echogenic renal parenchymal appearance bilaterally suggesting medical renal disease. Benign-appearing cysts in the right kidney. No hydronephrosis. Electronically Signed   By: Burman Nieves M.D.   On: 05/24/2016 21:28    Procedures .Critical Care Performed by: Shaune Pollack Authorized by: Shaune Pollack   Critical care provider statement:    Critical care time (minutes):  35   Critical care time was exclusive of:  Separately billable procedures and treating other patients   Critical care was necessary to treat or prevent imminent or life-threatening deterioration of the following conditions:  Circulatory failure, metabolic crisis, cardiac failure and renal  failure   Critical care was time spent personally by me on the following activities:  Blood draw for specimens, ordering and performing treatments and interventions, ordering and review of laboratory studies, ordering and review of radiographic studies, pulse oximetry, re-evaluation of patient's condition, review of old charts, obtaining history from patient or surrogate, development of treatment plan with patient or surrogate, discussions with consultants, evaluation of patient's response to treatment and examination of patient   I assumed direction of critical care for this patient from another provider in my specialty: no     (including critical care time)  Medications Ordered in ED Medications  amLODipine (NORVASC) tablet 10 mg (not administered)  enoxaparin (LOVENOX) injection 30 mg (30 mg Subcutaneous Given 05/24/16 2132)  sodium chloride flush (NS) 0.9 % injection 3 mL (3 mLs Intravenous Given 05/24/16 2137)  ondansetron (ZOFRAN) tablet 4 mg (not administered)    Or  ondansetron (ZOFRAN) injection 4 mg (not administered)  pantoprazole (PROTONIX) EC tablet 40 mg (40 mg Oral Not Given 05/24/16  2030)  labetalol (NORMODYNE) tablet 300 mg (300 mg Oral Given 05/24/16 2130)  ondansetron (ZOFRAN) injection 4 mg (4 mg Intramuscular Given 05/24/16 1943)  HYDROcodone-acetaminophen (NORCO/VICODIN) 5-325 MG per tablet 1 tablet (1 tablet Oral Given 05/24/16 2130)  hydrALAZINE (APRESOLINE) injection 5-10 mg (10 mg Intravenous Given 05/24/16 2131)  furosemide (LASIX) injection 80 mg (not administered)  hydrALAZINE (APRESOLINE) tablet 25 mg (not administered)  labetalol (NORMODYNE,TRANDATE) injection 10 mg (10 mg Intravenous Given 05/24/16 1636)  0.9 %  sodium chloride infusion ( Intravenous Stopped 05/24/16 1943)  HYDROmorphone (DILAUDID) injection 1 mg (1 mg Intravenous Given 05/24/16 1943)  amLODipine (NORVASC) tablet 5 mg (5 mg Oral Given 05/24/16 1943)     Initial Impression / Assessment and Plan / ED Course  I have reviewed the triage vital signs and the nursing notes.  Pertinent labs & imaging results that were available during my care of the patient were reviewed by me and considered in my medical decision making (see chart for details).  Clinical Course  45 year old male with past medical history of mild chronic kidney disease who presents with worsening hypertension and headache. On arrival, patient hypertensive to 188/124. In the room, systolics greater than 200 and diastolic greater than 130. Patient does complain of headache but has nonfocal neurological examination. Primary concern is hypertensive emergency versus urgency. EKG is nonischemic. Denies any chest pain. He does have some mild peripheral edema concerning for possible renal dysfunction. Will obtain screening labs and stat CT head.  CT head shows no acute abnormality. Lab work is concerning for severe acute on chronic kidney injury with creatinine 9.3. Bicarbonate is 20. Electrolytes are otherwise acceptable. Troponin is negative. BNP is elevated at 108. Urinalysis is pending at this time, I'm concerned for acute on chronic renal  failure secondary to hypertensive emergency. Patient also likely has a component of NSAID-induced nephropathy. I discussed the case with Dr. Arlean Hopping of nephrology who will evaluate the patient. Will start the patient on IV Cardene drip. He has been given IV labetalol with moderate improvement in his blood pressures at this time. Otherwise, will admit to stepdown unit at [facility] for further treatment.  Final diagnoses:  Hypertensive urgency  Acute on chronic kidney failure (HCC)  Elevated serum creatinine  Nonintractable headache, unspecified chronicity pattern, unspecified headache type    New Prescriptions Current Discharge Medication List       Shaune Pollack, MD 05/25/16 0110

## 2016-05-24 NOTE — ED Notes (Signed)
Attempted to call report on several occassions including 1915, 1930 (in which someone gave me the nurses number name,Sharonda, and number [phone]), 1937, and 1947 with no answer.  Charge nurse notified.

## 2016-05-24 NOTE — H&P (Signed)
History and Physical    [name] GEX:[id number] DOB: 02-10-1971 DOA: 05/24/2016  PCP: Nonnie Done., MD   Patient coming from: Home.  Chief Complaint: Headache.  HPI: [name] is a 45 y.o. male with medical history significant of hypertension, back surgery is coming to the emergency department due to severe headache since Wednesday.  Per patient, he has been having trouble with his blood pressure control. He has had a history of hypertension for about 5 years and was taken losartan/hydrochlorothiazide 50/12.5 mg by mouth daily until about 3 months ago, when he was doubled to 100/25 mg to obtain a better BP control. About 5 weeks ago, the patient was started on Norvasc 5 mg by mouth daily by his PCP. He states that he discontinued the losartan/HCTZ, since he apparently understood that he was supposed to change to amlodipine only for treatment, instead of adding it to the current therapy.   On Wednesday, the patient started having significant frontal and occipital headaches associated with nausea and photophobia, minimally relieved by aspirin 650 mg or ibuprofen 400 mg by mouth several times a day. He also restarted his Losartan/HCTZ 100/25 mg Wednesday. However, he states that the headaches and poor blood pressure control continued since then. He states, that this morning he took 2 tablets of Sudafed for his headache, thinking that he was a sinus headache, without any relief. At that point, he decided to come to the emergency department for evaluation. He denies chest pain, palpitations, dizziness, diaphoresis, dyspnea, PND or orthopnea, but complains of edema of feet and ankles and hands. He denies fever, chills, productive cough, abdominal pain, constipation, melena or hematochezia. He had an episode of diarrhea earlier today. He denies dysuria, oliguria, hematuria or urinary frequency.   ED Course: The patient received 10 mg of labetalol IVP and was temporarily  started on a Cardene infusion. His hemoglobin level was 11.4 g/dL today compared to 01.0 g/dL in April 2725. BUN/creatinine level was 57/9.3 mg/dL, previously in [id number] was 15/0.97 mg/dL. Electrocardiogram, chest radiograph and CT scan of the brain were unremarkable.  Review of Systems: As per HPI otherwise 10 point review of systems negative.    Past Medical History:  Diagnosis Date  . HNP (herniated nucleus pulposus), lumbar   . Hypertension   . Kidney problem     Past Surgical History:  Procedure Laterality Date  . BACK SURGERY    . HERNIA REPAIR  2009  . REFRACTIVE SURGERY       reports that he has never smoked. His smokeless tobacco use includes Chew. He reports that he drinks alcohol. He reports that he does not use drugs.  No Known Allergies  Family History  Problem Relation Age of Onset  . Hypertension Mother   . Diabetes Mother   . COPD Father   . Hypertension Brother     Prior to Admission medications   Medication Sig Start Date End Date Taking? Authorizing Provider  amLODipine (NORVASC) 5 MG tablet Take 5 mg by mouth daily.   Yes Historical Provider, MD  aspirin 325 MG tablet Take 650 mg by mouth every 4 (four) hours as needed for moderate pain or headache.   Yes Historical Provider, MD  ibuprofen (ADVIL,MOTRIN) 200 MG tablet Take 400 mg by mouth every 6 (six) hours as needed for headache or moderate pain.   Yes Historical Provider, MD  losartan-hydrochlorothiazide (HYZAAR) 100-25 MG tablet Take 1 tablet by mouth daily.   Yes Historical Provider, MD  pseudoephedrine (  SUDAFED) 30 MG tablet Take 60 mg by mouth every 4 (four) hours as needed for congestion.   Yes Historical Provider, MD    Physical Exam: Vitals:   05/24/16 1745 05/24/16 1800 05/24/16 1815 05/24/16 1830  BP: (!) 169/111 (!) 175/107 (!) 178/105 (!) 161/110  Pulse: 70 75 65 70  Resp: 16 15 16 18   Temp:      TempSrc:      SpO2: 99% 100% 100% 100%  Weight:      Height:           Constitutional: NAD, calm, comfortable Vitals:   05/24/16 1745 05/24/16 1800 05/24/16 1815 05/24/16 1830  BP: (!) 169/111 (!) 175/107 (!) 178/105 (!) 161/110  Pulse: 70 75 65 70  Resp: 16 15 16 18   Temp:      TempSrc:      SpO2: 99% 100% 100% 100%  Weight:      Height:       Eyes: PERRL, lids and conjunctivae normal ENMT: Mucous membranes are moist. Posterior pharynx clear of any exudate or lesions.Normal dentition.  Neck: normal, supple, no masses, no thyromegaly Respiratory: clear to auscultation bilaterally, no wheezing, no crackles. Normal respiratory effort. No accessory muscle use.  Cardiovascular: Regular rate and rhythm, no murmurs / rubs / gallops. Positive edema of hands, 1+ edema lower extremities. 2+ pedal pulses. No carotid bruits.  Abdomen: BS positive. no tenderness, no masses palpated. No hepatosplenomegaly.  Musculoskeletal: no clubbing / cyanosis. No joint deformity upper and lower extremities. Good ROM, no contractures. Normal muscle tone.  Skin: Multiple hyperpigmented lesions on trunk, particularly on the back. LS spine surgical scar present. Neurologic: CN 2-12 grossly intact. Sensation intact, DTR normal. Strength 5/5 in all 4.  Psychiatric: Normal judgment and insight. Alert and oriented x 4. Normal mood.      Labs on Admission: I have personally reviewed following labs and imaging studies  CBC:  Recent Labs Lab 05/24/16 1600  WBC 7.8  HGB 11.4*  HCT 31.7*  MCV 82.3  PLT 250   Basic Metabolic Panel:  Recent Labs Lab 05/24/16 1600  NA 138  K 5.1  CL 107  CO2 20*  GLUCOSE 92  BUN 57*  CREATININE 9.30*  CALCIUM 8.3*   GFR: Estimated Creatinine Clearance: 13.4 mL/min (by C-G formula based on SCr of 9.3 mg/dL).  Urine analysis:    Component Value Date/Time   COLORURINE YELLOW 05/24/2016 1657   APPEARANCEUR HAZY (A) 05/24/2016 1657   LABSPEC 1.015 05/24/2016 1657   PHURINE 5.5 05/24/2016 1657   GLUCOSEU NEGATIVE 05/24/2016  1657   HGBUR LARGE (A) 05/24/2016 1657   BILIRUBINUR NEGATIVE 05/24/2016 1657   KETONESUR NEGATIVE 05/24/2016 1657   PROTEINUR >300 (A) 05/24/2016 1657   UROBILINOGEN 0.2 01/19/2012 1116   NITRITE NEGATIVE 05/24/2016 1657   LEUKOCYTESUR NEGATIVE 05/24/2016 1657     Radiological Exams on Admission: Dg Chest 2 View  Result Date: 05/24/2016 CLINICAL DATA:  Worsening hypertension. EXAM: CHEST  2 VIEW COMPARISON:  None. FINDINGS: Normal heart size. Normal mediastinal contour. No pneumothorax. No pleural effusion. Lungs appear clear, with no acute consolidative airspace disease and no pulmonary edema. IMPRESSION: No active cardiopulmonary disease. Electronically Signed   By: Delbert Phenix M.D.   On: 05/24/2016 15:45   Ct Head Wo Contrast  Result Date: 05/24/2016 CLINICAL DATA:  45 year old male with severe headaches for 5 days and elevated blood pressure. Initial encounter. EXAM: CT HEAD WITHOUT CONTRAST TECHNIQUE: Contiguous axial images were obtained from the  base of the skull through the vertex without intravenous contrast. COMPARISON:  None. FINDINGS: Visualized paranasal sinuses and mastoids are well pneumatized. No osseous abnormality identified. Visualized orbits and scalp soft tissues are within normal limits. No midline shift, ventriculomegaly, mass effect, evidence of mass lesion, intracranial hemorrhage or evidence of cortically based acute infarction. Gray-white matter differentiation is within normal limits throughout the brain. No suspicious intracranial vascular hyperdensity. IMPRESSION: Normal noncontrast CT appearance of the brain. Electronically Signed   By: Odessa Fleming M.D.   On: 05/24/2016 15:46    EKG: Independently reviewed. Vent. rate 74 BPM PR interval * ms QRS duration 96 ms QT/QTc 395/439 ms P-R-T axes 40 72 41. Sinus rhythm  Assessment/Plan Principal Problem:   Hypertensive emergency Admit to stepdown [facility] /inpatient. Restart Cardene infusion if needed. Increase  amlodipine to 10 mg by mouth daily. Start labetalol 300 mg by mouth 3 times a day. Check echocardiogram. Monitor blood pressure closely.  Active Problems:   AKI (acute kidney injury) (HCC) Unknown etiology at this time, but suspicious for hypertensive nephropathy. Check urine analysis. Check microalbumin creatinine ratio. Check ANA and rheumatoid factor. Check total complement. Check ultrasound of the kidneys. Dr. [name] will evaluate the patient.    Anemia Likely due to renal causes. Monitor H&H. Erythropoietin per nephrology as needed.    Headache CT scan of the brain was negative. Continue blood pressure management. Analgesics as needed.    DVT prophylaxis: Lovenox SQ. Code Status: Full code. Family Communication: His wife was present in the room. Disposition Plan: Admit to [facility] for further workup and treatment. Consults called: [name], MD (nephrology). Admission status: Inpatient/stepdown   Bobette Mo MD [facility] Pager [phone].  If 7PM-7AM, please contact night-coverage [email or web address] Password [email or web address]  05/24/2016, 7:20 PM

## 2016-05-24 NOTE — Consult Note (Addendum)
Renal Service Consult Note [facility]  Ronald HolmesChristopher S Stone 05/24/2016 Ronald MetzSCHERTZ,[name] Requesting Physician:  Dr [name]  Reason for Consult:  Acute renal failure HPI: The patient is a 45 y.o. year-old with history of HTN came to ER today due to severe headaches.  Also having trouble controlling his BP.  Has been on medication for about 5 yrs.  Medication increases were made by pt's PCP 3 mos ago and then again 1 mo ago, however, the patient seems to have misunderstood the PCP and he stopped his prior BP medication(combination) when a new BP med was added, instead of continuing the old medication. He developed HA's about 1 week ago, has been taking NSAID's for headaches.  Does report leg edema, no SOB or cough, no CP or orthopnea.  No voiding difficulty.  In 2013 his creat done here was 0.97.  Today is it up at 9.3.  CXR/ EKG and head CT were all normal.     [place] Dr Azzie AlmasAlex Hadley.  Pt says his creatinine is around "2" to his knowledge.  His PCP is Dr Cheri RousJohn [name] and he just drew some labwork 1-2 weeks ago; the office called him and told him the kidney numbers were up bit didn't say how much.  Patient's main c/o recently is headache. No sig abd pain, no vomiting, did have nausea on Thursday and Friday this week.  Nocturnal urination has been worse the past few weeks.    Patient grew up in [place], went to [employer]. 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  They do brickwork for mostly residential [employer].  He and his wife have two sons age 427 and [id number].  Occ etoh, nonsmoker.       Admits: April 2013 - back surgery/ laminectomy L1  ROS  denies CP  no joint pain   no HA  no blurry vision  no rash  no diarrhea  no nausea/ vomiting  no dysuria  no difficulty voiding  no change in urine color    Past Medical History  Past Medical History:  Diagnosis Date  .  HNP (herniated nucleus pulposus), lumbar   . Hypertension   . Kidney problem    Past Surgical History  Past Surgical History:  Procedure Laterality Date  . BACK SURGERY    . HERNIA REPAIR  2009  . REFRACTIVE SURGERY     Family History  Family History  Problem Relation Age of Onset  . Hypertension Mother   . Diabetes Mother   . COPD Father   . Hypertension Brother    Social History  reports that he has never smoked. His smokeless tobacco use includes Chew. He reports that he drinks alcohol. He reports that he does not use drugs. Allergies No Known Allergies Home medications Prior to Admission medications   Medication Sig Start Date End Date Taking? Authorizing Provider  amLODipine (NORVASC) 5 MG tablet Take 5 mg by mouth daily.   Yes Historical Provider, MD  aspirin 325 MG tablet Take 650 mg by mouth every 4 (four) hours as needed for moderate pain or headache.   Yes Historical Provider, MD  ibuprofen (ADVIL,MOTRIN) 200 MG tablet Take 400 mg by mouth every 6 (six) hours as needed for headache or moderate pain.   Yes Historical Provider, MD  losartan-hydrochlorothiazide (HYZAAR) 100-25 MG tablet Take 1 tablet by mouth daily.   Yes Historical Provider,  MD  pseudoephedrine (SUDAFED) 30 MG tablet Take 60 mg by mouth every 4 (four) hours as needed for congestion.   Yes Historical Provider, MD   Liver Function Tests No results for input(s): AST, ALT, ALKPHOS, BILITOT, PROT, ALBUMIN in the last 168 hours. No results for input(s): LIPASE, AMYLASE in the last 168 hours. CBC  Recent Labs Lab 05/24/16 1600 05/24/16 2031  WBC 7.8 8.4  HGB 11.4* 11.6*  HCT 31.7* 33.3*  MCV 82.3 83.5  PLT 250 252   Basic Metabolic Panel  Recent Labs Lab 05/24/16 1600  NA 138  K 5.1  CL 107  CO2 20*  GLUCOSE 92  BUN 57*  CREATININE 9.30*  CALCIUM 8.3*   Iron/TIBC/Ferritin/ %Sat No results found for: IRON, TIBC, FERRITIN, IRONPCTSAT  Vitals:   05/24/16 2012 05/24/16 2015 05/24/16 2030  05/24/16 2130  BP: (!) 171/136 (!) 180/119 (!) 179/114 (!) 189/144  Pulse: 80 86 70 72  Resp: Temp: 97.7 F (36.5 C)     TempSrc: Oral     SpO2: 99% 99% 99%   Weight: 121.1 kg (267 lb)     Height:  (1.854 m)      Exam Gen heavy set WM sitting in chair , no distress, calm No rash, cyanosis or gangrene Sclera anicteric, throat clear  No jvd or bruits Chest clear bilat RRR no MRG Abd soft ntnd no mass or ascites +bs GU normal male MS no joint effusions or deformity Ext 2+ pretib LE edema bilat/ no  wounds or ulcers Neuro is alert, Ox 3 , nf, no asterixis  Na 138  K 5.1  CO2 20  BUn 57  Cr 9.30  Ca 8.3   WBC 8k  Hb 11 plt 252 UA hazy, rare bact, large Hb, >300 prot, neg LE/ nit, 6-30 wbc, 0-5 wbc/ epi Urine sediment (undersigned) > 10-30 rbc's/ hpf, +dysmorphic RBC's, +degenerating RBC/ WBC casts Renal US >>  9.3/ 12.3 cm kidneys, no hydro, diffusely increased renal parenchymal echotexture on both sides.  No hydronephrosis.    Assessment: 1.  Acute on CKD - pt has uncontrolled HTN, proteinuria and nephritic-appearing sediment.  Should get records from his nephrologist as to whether he had a kidney biopsy or not.  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 Also could have malignant HTN w TMA.  If renal function improves, consider renal biopsy.  Not uremic , but if renal function doesn't improve then he will end up on dialysis.   Will send off serologies and start IV lasix due to vol overload / LE edema and ^'Stone BP.   2.  HTN'sive urgency  - on po labetalol and po norvasc, add hydralazine and IV lasix, sig vol excess 3.  Hx back surgery 4.  Vol excess - significant   Plan - IV lasix 80 tid, serologies, f/u labs in am  [name] MD [facility] pager [phone]    cell [phone] 05/24/2016, 10:30 PM

## 2016-05-24 NOTE — ED Triage Notes (Signed)
Patient c/o severe headaches since Wednesday. Patient has been having trouble with his BP. 195/123 on Wednesday at PCP. Patient was on Losartan/HCTZ 100-25mg  and stopped it 5 weeks ago when he was started on Norvasc 5mg . Patient was restarted on the Losartan on Wednesday. There was a miscommunication between the patient and his PCP regarding the medications when the Norvasc was started. Patient denies changes in vision. Patient is hypertensive at this time. Patient has took ASA, Advil, and Sudafed for his headache today without relief. Patient states his head hurts less when he walks and more when sitting still. Patient A&Ox4, NAD noted.

## 2016-05-24 NOTE — Progress Notes (Signed)
Pt arrived on the floor 2007. No report was given by ED nurse. Nurse did not rec'd any calls on her phone. [facility] answered. Pt's BP 179/114 on admission. No PRN has been ordered. RN [facility]. Waiting for further orders.

## 2016-05-25 LAB — COMPREHENSIVE METABOLIC PANEL
ALBUMIN: 2.6 g/dL — AB (ref 3.5–5.0)
ALK PHOS: 63 U/L (ref 38–126)
ALT: 12 U/L — AB (ref 17–63)
ANION GAP: 10 (ref 5–15)
AST: 13 U/L — ABNORMAL LOW (ref 15–41)
BILIRUBIN TOTAL: 0.2 mg/dL — AB (ref 0.3–1.2)
BUN: 54 mg/dL — AB (ref 6–20)
CALCIUM: 8.5 mg/dL — AB (ref 8.9–10.3)
CO2: 22 mmol/L (ref 22–32)
CREATININE: 9.2 mg/dL — AB (ref 0.61–1.24)
Chloride: 104 mmol/L (ref 101–111)
GFR calc Af Amer: 7 mL/min — ABNORMAL LOW (ref >60)
GFR calc non Af Amer: 6 mL/min — ABNORMAL LOW (ref >60)
GLUCOSE: 105 mg/dL — AB (ref 65–99)
Potassium: 4.4 mmol/L (ref 3.5–5.1)
Sodium: 136 mmol/L (ref 135–145)
TOTAL PROTEIN: 5.2 g/dL — AB (ref 6.5–8.1)

## 2016-05-25 LAB — LIPID PANEL
CHOL/HDL RATIO: 6.8 ratio
CHOLESTEROL: 203 mg/dL — AB (ref 0–200)
HDL: 30 mg/dL — ABNORMAL LOW (ref >40)
LDL CALC: 124 mg/dL — AB (ref 0–99)
Triglycerides: 245 mg/dL — ABNORMAL HIGH (ref <150)
VLDL: 49 mg/dL — AB (ref 0–40)

## 2016-05-25 LAB — PROTEIN / CREATININE RATIO, URINE
Creatinine, Urine: 79.8 mg/dL
PROTEIN CREATININE RATIO: 7.89 mg/mg{creat} — AB (ref 0.00–0.15)
Total Protein, Urine: 630 mg/dL

## 2016-05-25 LAB — CBC
HEMATOCRIT: 30.2 % — AB (ref 39.0–52.0)
HEMOGLOBIN: 10.6 g/dL — AB (ref 13.0–17.0)
MCH: 29.3 pg (ref 26.0–34.0)
MCHC: 35.1 g/dL (ref 30.0–36.0)
MCV: 83.4 fL (ref 78.0–100.0)
Platelets: 212 10*3/uL (ref 150–400)
RBC: 3.62 MIL/uL — AB (ref 4.22–5.81)
RDW: 12.7 % (ref 11.5–15.5)
WBC: 7.4 10*3/uL (ref 4.0–10.5)

## 2016-05-25 LAB — HIV ANTIBODY (ROUTINE TESTING W REFLEX): HIV Screen 4th Generation wRfx: NONREACTIVE

## 2016-05-25 LAB — SODIUM, URINE, RANDOM: Sodium, Ur: 69 mmol/L

## 2016-05-25 LAB — RHEUMATOID FACTOR: Rhuematoid fact SerPl-aCnc: <10 IU/mL (ref 0.0–13.9)

## 2016-05-25 MED ORDER — SALINE SPRAY 0.65 % NA SOLN
1.0000 | NASAL | Status: DC | PRN
  Administered 2016-05-25: 1 via NASAL
  Filled 2016-05-25: qty 44

## 2016-05-25 NOTE — Progress Notes (Signed)
[facility] Progress Note   S: Patient feels well today. Family at bedside. Reports miscommunication with home BP medications (stopped one when another was added rather than continue both) Reports recent NSAID use for headaches. States PCP Dr. Cecille Amsterdam drew labwork 1-2 weeks ago. UOP 1500 ccs yesterday, 480 ccs this am  O:  Medications: Infusions:   Scheduled Medications: . amLODipine  10 mg Oral Daily  . enoxaparin (LOVENOX) injection  30 mg Subcutaneous Q24H  . furosemide  80 mg Intravenous Q8H  . hydrALAZINE  25 mg Oral Q8H  . labetalol  300 mg Oral TID  . pantoprazole  40 mg Oral Daily  . sodium chloride flush  3 mL Intravenous Q12H    Scheduled Meds: . amLODipine  10 mg Oral Daily  . enoxaparin (LOVENOX) injection  30 mg Subcutaneous Q24H  . furosemide  80 mg Intravenous Q8H  . hydrALAZINE  25 mg Oral Q8H  . labetalol  300 mg Oral TID  . pantoprazole  40 mg Oral Daily  . sodium chloride flush  3 mL Intravenous Q12H   Continuous Infusions:  PRN Meds:.hydrALAZINE, HYDROcodone-acetaminophen, ondansetron **OR** ondansetron (ZOFRAN) IV, ondansetron, sodium chloride  BP (!) 142/91 (BP Location: Right Arm)   Pulse 73   Temp 98.9 F (37.2 C) (Oral)   Resp 16   Ht 6\' 1"  (1.854 m)   Wt 267 lb (121.1 kg)   SpO2 98%   BMI 35.23 kg/m    Intake/Output Summary (Last 24 hours) at 05/25/16 1236 Last data filed at 05/25/16 1100  Gross per 24 hour  Intake              860 ml  Output             1980 ml  Net            -1120 ml    Weight change:   EXAM: General: resting in bed, no distress, pleasant HEENT:EOMI, no scleral icterus Cardiac: RRR Neuro: alert and oriented X3  Labs: Basic Metabolic Panel:  Recent Labs Lab 05/24/16 1600 05/25/16 0248  NA 138 136  K 5.1 4.4  CL 107 104  CO2 20* 22  GLUCOSE 92 105*  BUN 57* 54*  CREATININE 9.30* 9.20*  CALCIUM 8.3* 8.5*    Liver Function Tests:  Recent Labs Lab 05/25/16 0248  AST 13*  ALT 12*   ALKPHOS 63  BILITOT 0.2*  PROT 5.2*  ALBUMIN 2.6*   No results for input(s): LIPASE, AMYLASE in the last 168 hours. No results for input(s): AMMONIA in the last 168 hours.  CBC:  Recent Labs Lab 05/24/16 1600 05/24/16 2031 05/25/16 0248  WBC 7.8 8.4 7.4  HGB 11.4* 11.6* 10.6*  HCT 31.7* 33.3* 30.2*  MCV 82.3 83.5 83.4  PLT 250 252 212    Cardiac Enzymes: No results for input(s): CKTOTAL, CKMB, CKMBINDEX, TROPONINI in the last 168 hours.  CBG: No results for input(s): GLUCAP in the last 168 hours.  Iron Studies: No results for input(s): IRON, TIBC, TRANSFERRIN, FERRITIN in the last 72 hours.  ABG No results found for: PHART, PCO2ART, PO2ART, HCO3, TCO2, ACIDBASEDEF, O2SAT   Studies/Results: Dg Chest 2 View  Result Date: 05/24/2016 CLINICAL DATA:  Worsening hypertension. EXAM: CHEST  2 VIEW COMPARISON:  None. FINDINGS: Normal heart size. Normal mediastinal contour. No pneumothorax. No pleural effusion. Lungs appear clear, with no acute consolidative airspace disease and no pulmonary edema. IMPRESSION: No active cardiopulmonary disease. Electronically Signed   By: Corene Cornea  A Poff M.D.   On: 05/24/2016 15:45   Ct Head Wo Contrast  Result Date: 05/24/2016 CLINICAL DATA:  45 year old male with severe headaches for 5 days and elevated blood pressure. Initial encounter. EXAM: CT HEAD WITHOUT CONTRAST TECHNIQUE: Contiguous axial images were obtained from the base of the skull through the vertex without intravenous contrast. COMPARISON:  None. FINDINGS: Visualized paranasal sinuses and mastoids are well pneumatized. No osseous abnormality identified. Visualized orbits and scalp soft tissues are within normal limits. No midline shift, ventriculomegaly, mass effect, evidence of mass lesion, intracranial hemorrhage or evidence of cortically based acute infarction. Gray-white matter differentiation is within normal limits throughout the brain. No suspicious intracranial vascular  hyperdensity. IMPRESSION: Normal noncontrast CT appearance of the brain. Electronically Signed   By: Genevie Ann M.D.   On: 05/24/2016 15:46   US Renal  Result Date: 05/24/2016 CLINICAL DATA:  Acute kidney injury.  History of hypertension. EXAM: RENAL / URINARY TRACT ULTRASOUND COMPLETE COMPARISON:  None. FINDINGS: Right Kidney: Length: 9.3 cm. Simple appearing cystic lesions demonstrated in the right kidney. Upper pole lesion measures 1.8 cm maximal diameter. Lower pole lesion measures 4 cm maximal diameter. No solid mass lesions identified. Renal parenchymal echotexture appears diffusely increased. Left Kidney: Length: 12.3 cm. Diffusely increased renal parenchymal echotexture. No solid mass or hydronephrosis identified. Bladder: No bladder wall thickening or filling defects. IMPRESSION: Echogenic renal parenchymal appearance bilaterally suggesting medical renal disease. Benign-appearing cysts in the right kidney. No hydronephrosis. Electronically Signed   By: Lucienne Capers M.D.   On: 05/24/2016 21:28   UA hazy, rare bact, large Hb, >300 prot, neg LE/ nit, 6-30 wbc, 0-5 wbc/ epi Urine sediment (undersigned) > 10-30 rbc's/ hpf, +dysmorphic RBC's, +degenerating RBC/ WBC casts Renal US >>  9.3/ 12.3 cm kidneys, no hydro, diffusely increased renal parenchymal echotexture on both sides.  No hydronephrosis.   Assessment/Plan:  1.  Acute on CKD - Prior Creatinine 2.23, Prot/Cr ratio 4169 in January 2017. Patient has uncontrolled HTN, proteinuria and nephritic-appearing sediment. 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 Also could have malignant HTN with TMA.  If renal function improves, may consider renal biopsy. May ultimately need dialysis. Not uremic, no urgent indication for HD right now. -serologies pending (ANA, RF, Complement, SPEP, kappa/lamda light chains, C3/C4, Mpo/pr-3, anti-GBM, antistreptolysin O, acute hepatitis panel, HIV) -IV lasix 80 mg q8h for volume overload and BP -Monitor renal function -SCr 9.30 >>  9.20 -Will try to obtain labwork from Dr. Rayford Halsted office  2.  Hypertensive urgency  - on po labetalol, po norvasc, po hydralazine and IV lasix. BPs improving. 3.  Hx back surgery 4.  Vol excess - lasix as above    [name]  [facility] PGY2

## 2016-05-25 NOTE — Progress Notes (Signed)
PROGRESS NOTE    [name]  ZOX:[id number] DOB: 1971/09/18 DOA: 05/24/2016 PCP: Nonnie Done., MD   Chief Complaint  Patient presents with  . Headache    with Hypertension    Brief Narrative:  HPI on 05/24/2016 by Dr. Sanda Klein [name] is a 45 y.o. male with medical history significant of hypertension, back surgery is coming to the emergency department due to severe headache since Wednesday.  Per patient, he has been having trouble with his blood pressure control. He has had a history of hypertension for about 5 years and was taken losartan/hydrochlorothiazide 50/12.5 mg by mouth daily until about 3 months ago, when he was doubled to 100/25 mg to obtain a better BP control. About 5 weeks ago, the patient was started on Norvasc 5 mg by mouth daily by his PCP. He states that he discontinued the losartan/HCTZ, since he apparently understood that he was supposed to change to amlodipine only for treatment, instead of adding it to the current therapy.   On Wednesday, the patient started having significant frontal and occipital headaches associated with nausea and photophobia, minimally relieved by aspirin 650 mg or ibuprofen 400 mg by mouth several times a day. He also restarted his Losartan/HCTZ 100/25 mg Wednesday. However, he states that the headaches and poor blood pressure control continued since then. He states, that this morning he took 2 tablets of Sudafed for his headache, thinking that he was a sinus headache, without any relief. At that point, he decided to come to the emergency department for evaluation. He denies chest pain, palpitations, dizziness, diaphoresis, dyspnea, PND or orthopnea, but complains of edema of feet and ankles and hands. He denies fever, chills, productive cough, abdominal pain, constipation, melena or hematochezia. He had an episode of diarrhea earlier today. He denies dysuria, oliguria, hematuria or urinary frequency.   Assessment &  Plan   Acute on chronic kidney disease unknown stage -Likely secondary to multifactorial causes: NSAID use, HTN -Upon admission, creatinine 9.3, last Creatinine in the system 0.97 (2013), however per patient, last known creatinine around 2 -Pending records from PCP and nephrologist, Dr. Azzie Almas -Currently Creatinine 9.2 -Not placed on IVF due to lower ext edema and possible volume overload -Given lasix 80mg  IV q8h -Monitor intake/output -Renal US: no hydronephrosis, echogenic renal parenchymal appearance B?L- medical renal disease -Nephrology consulted and appreciated -Serologies pending (ANA, Complement C3/4, SPEP, RF, anti-GBM, MPO/PR3, ASO, HIV, hepatitis panel)  Accelerated hypertension -Due to confusion about medication regimen -Upon admission, BP 191/119 -Continue labetalol, amlodipine, hydralazine, IV lasix -BP better  Lower extremity edema/volume overload -Likely secondary to kidney injury -Echocardiogram pending   Normocytic Anemia/Anemia of chronic disease -hemoglobin currently 10.6 -Continue to monitor CBC  Headache -Possible secondary to the above -CT head: normal -Continue pain control  History of back surgery  DVT Prophylaxis  Lovneox  Code Status: Full  Family Communication: Wife at bedside  Disposition Plan: Admitted  Consultants Nephrology   Procedures  Renal US  Antibiotics   Anti-infectives    None      Subjective:   [name] seen and examined today.  Patient continues to complain of headache, but feels it has improved. Denies chest pain, shortness of breath, abdominal pain, nausea, vomiting, dizziness, visual changes.   Objective:   Vitals:   05/25/16 0500 05/25/16 0636 05/25/16 0800 05/25/16 1159  BP: (!) 138/94 (!) 165/101 (!) 159/111 (!) 142/91  Pulse: 70  69 73  Resp: 13  15 16   Temp:  97.7 F (36.5 C) 98.9 F (37.2 C)  TempSrc:   Oral Oral  SpO2: 98%  99% 98%  Weight:      Height:        Intake/Output  Summary (Last 24 hours) at 05/25/16 1352 Last data filed at 05/25/16 1100  Gross per 24 hour  Intake              860 ml  Output             1980 ml  Net            -1120 ml   Filed Weights   05/24/16 1520 05/24/16 2012  Weight: 115.7 kg (255 lb) 121.1 kg (267 lb)    Exam  General: Well developed, well nourished, NAD, appears stated age  HEENT: NCAT, PERRLA, EOMI, Anicteic Sclera, mucous membranes moist.   Neck: Supple, no JVD, no masses  Cardiovascular: S1 S2 auscultated, no rubs, murmurs or gallops. Regular rate and rhythm.  Respiratory: Clear to auscultation bilaterally with equal chest rise  Abdomen: Soft, obese, nontender, nondistended, + bowel sounds  Extremities: warm dry without cyanosis clubbing or edema  Neuro: AAOx3, nonfocal  Skin: Without rashes exudates or nodules  Psych: Normal affect and demeanor with intact judgement and insight   Data Reviewed: I have personally reviewed following labs and imaging studies  CBC:  Recent Labs Lab 05/24/16 1600 05/24/16 2031 05/25/16 0248  WBC 7.8 8.4 7.4  HGB 11.4* 11.6* 10.6*  HCT 31.7* 33.3* 30.2*  MCV 82.3 83.5 83.4  PLT 250 252 212   Basic Metabolic Panel:  Recent Labs Lab 05/24/16 1600 05/25/16 0248  NA 138 136  K 5.1 4.4  CL 107 104  CO2 20* 22  GLUCOSE 92 105*  BUN 57* 54*  CREATININE 9.30* 9.20*  CALCIUM 8.3* 8.5*   GFR: Estimated Creatinine Clearance: 13.8 mL/min (by C-G formula based on SCr of 9.2 mg/dL). Liver Function Tests:  Recent Labs Lab 05/25/16 0248  AST 13*  ALT 12*  ALKPHOS 63  BILITOT 0.2*  PROT 5.2*  ALBUMIN 2.6*   No results for input(s): LIPASE, AMYLASE in the last 168 hours. No results for input(s): AMMONIA in the last 168 hours. Coagulation Profile: No results for input(s): INR, PROTIME in the last 168 hours. Cardiac Enzymes: No results for input(s): CKTOTAL, CKMB, CKMBINDEX, TROPONINI in the last 168 hours. BNP (last 3 results) No results for input(s):  PROBNP in the last 8760 hours. HbA1C: No results for input(s): HGBA1C in the last 72 hours. CBG: No results for input(s): GLUCAP in the last 168 hours. Lipid Profile:  Recent Labs  05/25/16 0248  CHOL 203*  HDL 30*  LDLCALC 124*  TRIG 245*  CHOLHDL 6.8   Thyroid Function Tests: No results for input(s): TSH, T4TOTAL, FREET4, T3FREE, THYROIDAB in the last 72 hours. Anemia Panel: No results for input(s): VITAMINB12, FOLATE, FERRITIN, TIBC, IRON, RETICCTPCT in the last 72 hours. Urine analysis:    Component Value Date/Time   COLORURINE YELLOW 05/24/2016 1657   APPEARANCEUR HAZY (A) 05/24/2016 1657   LABSPEC 1.015 05/24/2016 1657   PHURINE 5.5 05/24/2016 1657   GLUCOSEU NEGATIVE 05/24/2016 1657   HGBUR LARGE (A) 05/24/2016 1657   BILIRUBINUR NEGATIVE 05/24/2016 1657   KETONESUR NEGATIVE 05/24/2016 1657   PROTEINUR >300 (A) 05/24/2016 1657   UROBILINOGEN 0.2 01/19/2012 1116   NITRITE NEGATIVE 05/24/2016 1657   LEUKOCYTESUR NEGATIVE 05/24/2016 1657   Sepsis Labs: @LABRCNTIP (procalcitonin:4,lacticidven:4)  ) Recent Results (from the past  240 hour(s))  MRSA PCR Screening     Status: None   Collection Time: 05/24/16  8:14 PM  Result Value Ref Range Status   MRSA by PCR NEGATIVE NEGATIVE Final    Comment:        The GeneXpert MRSA Assay (FDA approved for NASAL specimens only), is one component of a comprehensive MRSA colonization surveillance program. It is not intended to diagnose MRSA infection nor to guide or monitor treatment for MRSA infections.       Radiology Studies: Dg Chest 2 View  Result Date: 05/24/2016 CLINICAL DATA:  Worsening hypertension. EXAM: CHEST  2 VIEW COMPARISON:  None. FINDINGS: Normal heart size. Normal mediastinal contour. No pneumothorax. No pleural effusion. Lungs appear clear, with no acute consolidative airspace disease and no pulmonary edema. IMPRESSION: No active cardiopulmonary disease. Electronically Signed   By: Delbert Phenix M.D.    On: 05/24/2016 15:45   Ct Head Wo Contrast  Result Date: 05/24/2016 CLINICAL DATA:  45 year old male with severe headaches for 5 days and elevated blood pressure. Initial encounter. EXAM: CT HEAD WITHOUT CONTRAST TECHNIQUE: Contiguous axial images were obtained from the base of the skull through the vertex without intravenous contrast. COMPARISON:  None. FINDINGS: Visualized paranasal sinuses and mastoids are well pneumatized. No osseous abnormality identified. Visualized orbits and scalp soft tissues are within normal limits. No midline shift, ventriculomegaly, mass effect, evidence of mass lesion, intracranial hemorrhage or evidence of cortically based acute infarction. Gray-white matter differentiation is within normal limits throughout the brain. No suspicious intracranial vascular hyperdensity. IMPRESSION: Normal noncontrast CT appearance of the brain. Electronically Signed   By: Odessa Fleming M.D.   On: 05/24/2016 15:46   US Renal  Result Date: 05/24/2016 CLINICAL DATA:  Acute kidney injury.  History of hypertension. EXAM: RENAL / URINARY TRACT ULTRASOUND COMPLETE COMPARISON:  None. FINDINGS: Right Kidney: Length: 9.3 cm. Simple appearing cystic lesions demonstrated in the right kidney. Upper pole lesion measures 1.8 cm maximal diameter. Lower pole lesion measures 4 cm maximal diameter. No solid mass lesions identified. Renal parenchymal echotexture appears diffusely increased. Left Kidney: Length: 12.3 cm. Diffusely increased renal parenchymal echotexture. No solid mass or hydronephrosis identified. Bladder: No bladder wall thickening or filling defects. IMPRESSION: Echogenic renal parenchymal appearance bilaterally suggesting medical renal disease. Benign-appearing cysts in the right kidney. No hydronephrosis. Electronically Signed   By: Burman Nieves M.D.   On: 05/24/2016 21:28     Scheduled Meds: . amLODipine  10 mg Oral Daily  . enoxaparin (LOVENOX) injection  30 mg Subcutaneous Q24H  .  furosemide  80 mg Intravenous Q8H  . hydrALAZINE  25 mg Oral Q8H  . labetalol  300 mg Oral TID  . pantoprazole  40 mg Oral Daily  . sodium chloride flush  3 mL Intravenous Q12H   Continuous Infusions:    LOS: 1 day   Time Spent in minutes   30 minutes  Kaileena Obi D.O. on 05/25/2016 at 1:52 PM  Between 7am to 7pm - Pager - [phone]  After 7pm go to [email or web address] - password [email or web address]  And look for the night coverage person covering for me after hours  [facility] Office  [phone]

## 2016-05-25 NOTE — Plan of Care (Signed)
Problem: Education: Goal: Knowledge of disease and its progression will improve Outcome: Progressing Pt was educated on renal diet, fluid restriction 1200 cc and BUN, creatinine levels normal.  Problem: Health Behavior/Discharge Planning: Goal: Ability to manage health-related needs will improve Outcome: Progressing Dietary measures explained

## 2016-05-26 ENCOUNTER — Inpatient Hospital Stay (HOSPITAL_COMMUNITY): Payer: BLUE CROSS/BLUE SHIELD

## 2016-05-26 ENCOUNTER — Other Ambulatory Visit (HOSPITAL_COMMUNITY): Payer: Self-pay

## 2016-05-26 DIAGNOSIS — N189 Chronic kidney disease, unspecified: Secondary | ICD-10-CM

## 2016-05-26 DIAGNOSIS — N184 Chronic kidney disease, stage 4 (severe): Secondary | ICD-10-CM

## 2016-05-26 LAB — PROTEIN ELECTROPHORESIS, SERUM
A/G Ratio: 1.2 (ref 0.7–1.7)
ALPHA-1-GLOBULIN: 0.2 g/dL (ref 0.0–0.4)
ALPHA-2-GLOBULIN: 0.6 g/dL (ref 0.4–1.0)
Albumin ELP: 2.8 g/dL — ABNORMAL LOW (ref 2.9–4.4)
BETA GLOBULIN: 0.9 g/dL (ref 0.7–1.3)
GLOBULIN, TOTAL: 2.4 g/dL (ref 2.2–3.9)
Gamma Globulin: 0.7 g/dL (ref 0.4–1.8)
Total Protein ELP: 5.2 g/dL — ABNORMAL LOW (ref 6.0–8.5)

## 2016-05-26 LAB — CBC
HEMATOCRIT: 28.4 % — AB (ref 39.0–52.0)
Hemoglobin: 9.8 g/dL — ABNORMAL LOW (ref 13.0–17.0)
MCH: 28.7 pg (ref 26.0–34.0)
MCHC: 34.5 g/dL (ref 30.0–36.0)
MCV: 83.3 fL (ref 78.0–100.0)
Platelets: 226 10*3/uL (ref 150–400)
RBC: 3.41 MIL/uL — ABNORMAL LOW (ref 4.22–5.81)
RDW: 12.5 % (ref 11.5–15.5)
WBC: 6.4 10*3/uL (ref 4.0–10.5)

## 2016-05-26 LAB — ANTISTREPTOLYSIN O TITER

## 2016-05-26 LAB — BASIC METABOLIC PANEL
Anion gap: 11 (ref 5–15)
BUN: 61 mg/dL — ABNORMAL HIGH (ref 6–20)
CALCIUM: 8.3 mg/dL — AB (ref 8.9–10.3)
CO2: 22 mmol/L (ref 22–32)
CREATININE: 10.04 mg/dL — AB (ref 0.61–1.24)
Chloride: 101 mmol/L (ref 101–111)
GFR calc non Af Amer: 5 mL/min — ABNORMAL LOW (ref >60)
GFR, EST AFRICAN AMERICAN: 6 mL/min — AB (ref >60)
GLUCOSE: 96 mg/dL (ref 65–99)
Potassium: 4.2 mmol/L (ref 3.5–5.1)
Sodium: 134 mmol/L — ABNORMAL LOW (ref 135–145)

## 2016-05-26 LAB — KAPPA/LAMBDA LIGHT CHAINS
KAPPA FREE LGHT CHN: 104.3 mg/L — AB (ref 3.3–19.4)
Kappa, lambda light chain ratio: 1.3 (ref 0.26–1.65)
Lambda free light chains: 80.4 mg/L — ABNORMAL HIGH (ref 5.7–26.3)

## 2016-05-26 LAB — HEPATITIS PANEL, ACUTE
HCV Ab: <0.1 s/co ratio (ref 0.0–0.9)
HEP B C IGM: NEGATIVE
HEP B S AG: NEGATIVE
Hep A IgM: NEGATIVE

## 2016-05-26 LAB — ANTINUCLEAR ANTIBODIES, IFA: ANTINUCLEAR ANTIBODIES, IFA: NEGATIVE

## 2016-05-26 LAB — C4 COMPLEMENT: Complement C4, Body Fluid: 30 mg/dL (ref 14–44)

## 2016-05-26 LAB — C3 COMPLEMENT: C3 Complement: 121 mg/dL (ref 82–167)

## 2016-05-26 LAB — COMPLEMENT, TOTAL

## 2016-05-26 LAB — MPO/PR-3 (ANCA) ANTIBODIES: Myeloperoxidase Abs: <9 U/mL (ref 0.0–9.0)

## 2016-05-26 MED ORDER — FUROSEMIDE 40 MG PO TABS
40.0000 mg | ORAL_TABLET | Freq: Every day | ORAL | Status: DC
  Administered 2016-05-27 – 2016-05-31 (×4): 40 mg via ORAL
  Filled 2016-05-26 (×4): qty 1

## 2016-05-26 NOTE — Progress Notes (Signed)
Left Upper Extremity Vein Map    Cephalic  Segment Diameter Depth Comment  1. Axilla 4.683mm 6.91mm   2. Mid upper arm 4.803mm 3.689mm   3. Above [facility] 3.666mm 2.694mm   4. In AC 5mm 3.635mm   5. Below AC 3.467mm 4.446mm   6. Mid forearm 4.563mm 4.88mm   7. Wrist mm mm Not visualized   Basilic  Segment Diameter Depth Comment  2. Mid upper arm mm mm Not visualized   3. Above AC 3.717mm 6.326mm   4. In AC mm mm Not visualized IV  5. Below AC mm mm Not Visualized IV  6. Mid forearm mm mm Multiple branches unable to visualize well  7. Wrist mm mm Multiple branches unable to visualize well die to size

## 2016-05-26 NOTE — Progress Notes (Signed)
[facility] Progress Note   S: Patient feels improved today. Wife present. Headaches have resolved. Still some swelling lower extremities. Patient reports recent creatinine level drawn at PCP's (Dr. Wendie Agreste) office was 3.96. Called Dr. Rayford Halsted office yesterday, no renal labs on file. UOP 2520 ccs yesterday  O:  Medications: Infusions:   Scheduled Medications: . amLODipine  10 mg Oral Daily  . enoxaparin (LOVENOX) injection  30 mg Subcutaneous Q24H  . furosemide  80 mg Intravenous Q8H  . hydrALAZINE  25 mg Oral Q8H  . labetalol  300 mg Oral TID  . pantoprazole  40 mg Oral Daily  . sodium chloride flush  3 mL Intravenous Q12H    Scheduled Meds: . amLODipine  10 mg Oral Daily  . enoxaparin (LOVENOX) injection  30 mg Subcutaneous Q24H  . furosemide  80 mg Intravenous Q8H  . hydrALAZINE  25 mg Oral Q8H  . labetalol  300 mg Oral TID  . pantoprazole  40 mg Oral Daily  . sodium chloride flush  3 mL Intravenous Q12H   Continuous Infusions:  PRN Meds:.hydrALAZINE, HYDROcodone-acetaminophen, ondansetron **OR** ondansetron (ZOFRAN) IV, ondansetron, sodium chloride  BP (!) 139/92   Pulse 85   Temp 98.2 F (36.8 C) (Oral)   Resp 14   Ht 6\' 1"  (1.854 m)   Wt 264 lb 1.8 oz (119.8 kg)   SpO2 97%   BMI 34.85 kg/m    Intake/Output Summary (Last 24 hours) at 05/26/16 0844 Last data filed at 05/26/16 0321  Gross per 24 hour  Intake              600 ml  Output             2520 ml  Net            -1920 ml    Weight change: 9 lb 1.8 oz (4.133 kg)  EXAM: General: sitting up in chair, no acute distress Cardiac: RRR, no rubs, murmurs or gallops Pulm: clear to auscultation bilaterally, moving normal volumes of air Abd: soft, nontender, nondistended, BS present Ext: warm and well perfused, +1 pitting edema lower extremities Neuro: alert and oriented X3  Labs: Basic Metabolic Panel:  Recent Labs Lab 05/24/16 1600 05/25/16 0248 05/26/16 0350  NA 138 136 134*  K 5.1  4.4 4.2  CL 107 104 101  CO2 20* 22 22  GLUCOSE 92 105* 96  BUN 57* 54* 61*  CREATININE 9.30* 9.20* 10.04*  CALCIUM 8.3* 8.5* 8.3*    Liver Function Tests:  Recent Labs Lab 05/25/16 0248  AST 13*  ALT 12*  ALKPHOS 63  BILITOT 0.2*  PROT 5.2*  ALBUMIN 2.6*   No results for input(s): LIPASE, AMYLASE in the last 168 hours. No results for input(s): AMMONIA in the last 168 hours.  CBC:  Recent Labs Lab 05/24/16 1600 05/24/16 2031 05/25/16 0248 05/26/16 0350  WBC 7.8 8.4 7.4 6.4  HGB 11.4* 11.6* 10.6* 9.8*  HCT 31.7* 33.3* 30.2* 28.4*  MCV 82.3 83.5 83.4 83.3  PLT 250 252 212 226    Cardiac Enzymes: No results for input(s): CKTOTAL, CKMB, CKMBINDEX, TROPONINI in the last 168 hours.  CBG: No results for input(s): GLUCAP in the last 168 hours.  Iron Studies: No results for input(s): IRON, TIBC, TRANSFERRIN, FERRITIN in the last 72 hours.  ABG No results found for: PHART, PCO2ART, PO2ART, HCO3, TCO2, ACIDBASEDEF, O2SAT   Studies/Results: Dg Chest 2 View  Result Date: 05/24/2016 CLINICAL DATA:  Worsening hypertension. EXAM: CHEST  2 VIEW COMPARISON:  None. FINDINGS: Normal heart size. Normal mediastinal contour. No pneumothorax. No pleural effusion. Lungs appear clear, with no acute consolidative airspace disease and no pulmonary edema. IMPRESSION: No active cardiopulmonary disease. Electronically Signed   By: Ilona Sorrel M.D.   On: 05/24/2016 15:45   Ct Head Wo Contrast  Result Date: 05/24/2016 CLINICAL DATA:  45 year old male with severe headaches for 5 days and elevated blood pressure. Initial encounter. EXAM: CT HEAD WITHOUT CONTRAST TECHNIQUE: Contiguous axial images were obtained from the base of the skull through the vertex without intravenous contrast. COMPARISON:  None. FINDINGS: Visualized paranasal sinuses and mastoids are well pneumatized. No osseous abnormality identified. Visualized orbits and scalp soft tissues are within normal limits. No midline  shift, ventriculomegaly, mass effect, evidence of mass lesion, intracranial hemorrhage or evidence of cortically based acute infarction. Gray-white matter differentiation is within normal limits throughout the brain. No suspicious intracranial vascular hyperdensity. IMPRESSION: Normal noncontrast CT appearance of the brain. Electronically Signed   By: Genevie Ann M.D.   On: 05/24/2016 15:46   US Renal  Result Date: 05/24/2016 CLINICAL DATA:  Acute kidney injury.  History of hypertension. EXAM: RENAL / URINARY TRACT ULTRASOUND COMPLETE COMPARISON:  None. FINDINGS: Right Kidney: Length: 9.3 cm. Simple appearing cystic lesions demonstrated in the right kidney. Upper pole lesion measures 1.8 cm maximal diameter. Lower pole lesion measures 4 cm maximal diameter. No solid mass lesions identified. Renal parenchymal echotexture appears diffusely increased. Left Kidney: Length: 12.3 cm. Diffusely increased renal parenchymal echotexture. No solid mass or hydronephrosis identified. Bladder: No bladder wall thickening or filling defects. IMPRESSION: Echogenic renal parenchymal appearance bilaterally suggesting medical renal disease. Benign-appearing cysts in the right kidney. No hydronephrosis. Electronically Signed   By: Lucienne Capers M.D.   On: 05/24/2016 21:28   UA hazy, rare bact, large Hb, >300 prot, neg LE/ nit, 6-30 wbc, 0-5 wbc/ epi Urine sediment (undersigned) > 10-30 rbc's/ hpf, +dysmorphic RBC's, +degenerating RBC/ WBC casts Renal US >>  9.3/ 12.3 cm kidneys, no hydro, diffusely increased renal parenchymal echotexture on both sides.  No hydronephrosis.  RF, Complement, C3/C4, antistreptolysin O, acute hepatitis panel, HIV unremarkable  Assessment/Plan:  1.  Acute on CKD - Prior Creatinine 2.23, Prot/Cr ratio 4169 in January 2017. Patient has uncontrolled HTN, proteinuria and nephritic-appearing sediment. 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 Also could have malignant HTN with TMA.  If renal function improves, may  consider renal biopsy. SCr continues to rise. Not uremic, no urgent indication for HD right now. Anticipate he will ultimately need hemodialysis with progression of CKD. Discussed the probability of this with patient and will proceed with evaluation of shunt placement. -serologies pending (ANA, SPEP, kappa/lamda light chains, Mpo/pr-3, anti-GBM) -IV lasix 80 mg q8h --> change to oral Lasix 40 mg daily -Monitor renal function -SCr 9.30 >> 9.20 >> 10.04 -No renal labwork on file per discussion with Dr. Rayford Halsted office -Obtain vein mapping -Consult VVS  2.  Hypertensive urgency  - on po labetalol, po norvasc, po hydralazine and po lasix. BPs stable. 3.  Hx back surgery 4.  Vol excess - improving, adjust lasix as above    Dawana Asper  [facility] PGY2

## 2016-05-26 NOTE — Progress Notes (Signed)
PROGRESS NOTE    [name]  JXB:[id number] DOB: [birth year] DOA: 05/24/2016 PCP: Nonnie Done., MD   Chief Complaint  Patient presents with  . Headache    with Hypertension    Brief Narrative:  HPI on 05/24/2016 by Dr. Sanda Klein [name] is a 45 y.o. male with medical history significant of hypertension, back surgery is coming to the emergency department due to severe headache since Wednesday.  Per patient, he has been having trouble with his blood pressure control. He has had a history of hypertension for about 5 years and was taken losartan/hydrochlorothiazide 50/12.5 mg by mouth daily until about 3 months ago, when he was doubled to 100/25 mg to obtain a better BP control. About 5 weeks ago, the patient was started on Norvasc 5 mg by mouth daily by his PCP. He states that he discontinued the losartan/HCTZ, since he apparently understood that he was supposed to change to amlodipine only for treatment, instead of adding it to the current therapy.   On Wednesday, the patient started having significant frontal and occipital headaches associated with nausea and photophobia, minimally relieved by aspirin 650 mg or ibuprofen 400 mg by mouth several times a day. He also restarted his Losartan/HCTZ 100/25 mg Wednesday. However, he states that the headaches and poor blood pressure control continued since then. He states, that this morning he took 2 tablets of Sudafed for his headache, thinking that he was a sinus headache, without any relief. At that point, he decided to come to the emergency department for evaluation. He denies chest pain, palpitations, dizziness, diaphoresis, dyspnea, PND or orthopnea, but complains of edema of feet and ankles and hands. He denies fever, chills, productive cough, abdominal pain, constipation, melena or hematochezia. He had an episode of diarrhea earlier today. He denies dysuria, oliguria, hematuria or urinary frequency.   Assessment &  Plan   Acute on chronic kidney disease unknown stage -Likely secondary to multifactorial causes: NSAID use, HTN -Upon admission, creatinine 9.3, last Creatinine in the system 0.97 (2013), however per patient, last known creatinine around 2 -Pending records from PCP and nephrologist, Dr. Azzie Almas -Currently Creatinine 10.04 -Not placed on IVF due to lower ext edema and possible volume overload -Given lasix 80mg  IV q8h -Monitor intake/output and fluid restriction. UOP over past 24 hrs: 2520cc -Renal US: no hydronephrosis, echogenic renal parenchymal appearance B/L- medical renal disease -Nephrology consulted and appreciated -Serologies pending (ANA, SPEP, RF, anti-GBM, MPO/PR3, ASO) -Complement CH50: >60,  C3/4 WNL, RF neg, Hep panel negative, HIV nonreactive  Accelerated hypertension/Unc HTN -Due to confusion about medication regimen -Upon admission, BP 191/119 -Continue labetalol, amlodipine, hydralazine, IV lasix -BP better, currently 133/82  Lower extremity edema/volume overload -Likely secondary to kidney injury -Mildly elevated BNP upon admission 108 -Echocardiogram pending   Normocytic Anemia/Anemia of chronic disease -hemoglobin currently 9.8 -Continue to monitor CBC  Headache -Resolved, Possible secondary to the above -CT head: normal -Continue pain control  History of back surgery  DVT Prophylaxis  Lovneox  Code Status: Full  Family Communication: Wife at bedside  Disposition Plan: Admitted, pending improvement in creatinine  Consultants Nephrology   Procedures  Renal US  Antibiotics   Anti-infectives    None      Subjective:   [name] seen and examined today.  Patient denies further headache. Denies chest pain, shortness of breath, abdominal pain, nausea, vomiting, dizziness, visual changes.   Objective:   Vitals:   05/26/16 0400 05/26/16 0500 05/26/16 0600 05/26/16 0850  BP: 132/83 138/79 (!) 139/92 133/82  Pulse: 74 83 85     Resp: 13 14 14    Temp:    98.1 F (36.7 C)  TempSrc:    Oral  SpO2: 96% 94% 97%   Weight:      Height:        Intake/Output Summary (Last 24 hours) at 05/26/16 1027 Last data filed at 05/26/16 1000  Gross per 24 hour  Intake              240 ml  Output             3100 ml  Net            -2860 ml   Filed Weights   05/24/16 1520 05/24/16 2012 05/25/16 2337  Weight: 115.7 kg (255 lb) 121.1 kg (267 lb) 119.8 kg (264 lb 1.8 oz)    Exam  General: Well developed, well nourished, NAD, appears stated age  HEENT: NCAT, mucous membranes moist.   Cardiovascular: S1 S2 auscultated, no rubs, murmurs or gallops. Regular rate and rhythm.  Respiratory: Clear to auscultation bilaterally with equal chest rise  Abdomen: Soft, obese, nontender, nondistended, + bowel sounds  Extremities: warm dry without cyanosis clubbing. +1LE edema B/L  Neuro: AAOx3, nonfocal  Skin: Without rashes exudates or nodules  Psych: Appropriate mood and affect, pleasant   Data Reviewed: I have personally reviewed following labs and imaging studies  CBC:  Recent Labs Lab 05/24/16 1600 05/24/16 2031 05/25/16 0248 05/26/16 0350  WBC 7.8 8.4 7.4 6.4  HGB 11.4* 11.6* 10.6* 9.8*  HCT 31.7* 33.3* 30.2* 28.4*  MCV 82.3 83.5 83.4 83.3  PLT 250 252 212 226   Basic Metabolic Panel:  Recent Labs Lab 05/24/16 1600 05/25/16 0248 05/26/16 0350  NA 138 136 134*  K 5.1 4.4 4.2  CL 107 104 101  CO2 20* 22 22  GLUCOSE 92 105* 96  BUN 57* 54* 61*  CREATININE 9.30* 9.20* 10.04*  CALCIUM 8.3* 8.5* 8.3*   GFR: Estimated Creatinine Clearance: 12.6 mL/min (by C-G formula based on SCr of 10.04 mg/dL). Liver Function Tests:  Recent Labs Lab 05/25/16 0248  AST 13*  ALT 12*  ALKPHOS 63  BILITOT 0.2*  PROT 5.2*  ALBUMIN 2.6*   No results for input(s): LIPASE, AMYLASE in the last 168 hours. No results for input(s): AMMONIA in the last 168 hours. Coagulation Profile: No results for input(s): INR,  PROTIME in the last 168 hours. Cardiac Enzymes: No results for input(s): CKTOTAL, CKMB, CKMBINDEX, TROPONINI in the last 168 hours. BNP (last 3 results) No results for input(s): PROBNP in the last 8760 hours. HbA1C: No results for input(s): HGBA1C in the last 72 hours. CBG: No results for input(s): GLUCAP in the last 168 hours. Lipid Profile:  Recent Labs  05/25/16 0248  CHOL 203*  HDL 30*  LDLCALC 124*  TRIG 245*  CHOLHDL 6.8   Thyroid Function Tests: No results for input(s): TSH, T4TOTAL, FREET4, T3FREE, THYROIDAB in the last 72 hours. Anemia Panel: No results for input(s): VITAMINB12, FOLATE, FERRITIN, TIBC, IRON, RETICCTPCT in the last 72 hours. Urine analysis:    Component Value Date/Time   COLORURINE YELLOW 05/24/2016 1657   APPEARANCEUR HAZY (A) 05/24/2016 1657   LABSPEC 1.015 05/24/2016 1657   PHURINE 5.5 05/24/2016 1657   GLUCOSEU NEGATIVE 05/24/2016 1657   HGBUR LARGE (A) 05/24/2016 1657   BILIRUBINUR NEGATIVE 05/24/2016 1657   KETONESUR NEGATIVE 05/24/2016 1657   PROTEINUR >300 (A) 05/24/2016  1657   UROBILINOGEN 0.2 01/19/2012 1116   NITRITE NEGATIVE 05/24/2016 1657   LEUKOCYTESUR NEGATIVE 05/24/2016 1657   Sepsis Labs: @LABRCNTIP (procalcitonin:4,lacticidven:4)  ) Recent Results (from the past 240 hour(s))  MRSA PCR Screening     Status: None   Collection Time: 05/24/16  8:14 PM  Result Value Ref Range Status   MRSA by PCR NEGATIVE NEGATIVE Final    Comment:        The GeneXpert MRSA Assay (FDA approved for NASAL specimens only), is one component of a comprehensive MRSA colonization surveillance program. It is not intended to diagnose MRSA infection nor to guide or monitor treatment for MRSA infections.       Radiology Studies: Dg Chest 2 View  Result Date: 05/24/2016 CLINICAL DATA:  Worsening hypertension. EXAM: CHEST  2 VIEW COMPARISON:  None. FINDINGS: Normal heart size. Normal mediastinal contour. No pneumothorax. No pleural effusion.  Lungs appear clear, with no acute consolidative airspace disease and no pulmonary edema. IMPRESSION: No active cardiopulmonary disease. Electronically Signed   By: Delbert Phenix M.D.   On: 05/24/2016 15:45   Ct Head Wo Contrast  Result Date: 05/24/2016 CLINICAL DATA:  45 year old male with severe headaches for 5 days and elevated blood pressure. Initial encounter. EXAM: CT HEAD WITHOUT CONTRAST TECHNIQUE: Contiguous axial images were obtained from the base of the skull through the vertex without intravenous contrast. COMPARISON:  None. FINDINGS: Visualized paranasal sinuses and mastoids are well pneumatized. No osseous abnormality identified. Visualized orbits and scalp soft tissues are within normal limits. No midline shift, ventriculomegaly, mass effect, evidence of mass lesion, intracranial hemorrhage or evidence of cortically based acute infarction. Gray-white matter differentiation is within normal limits throughout the brain. No suspicious intracranial vascular hyperdensity. IMPRESSION: Normal noncontrast CT appearance of the brain. Electronically Signed   By: Odessa Fleming M.D.   On: 05/24/2016 15:46   US Renal  Result Date: 05/24/2016 CLINICAL DATA:  Acute kidney injury.  History of hypertension. EXAM: RENAL / URINARY TRACT ULTRASOUND COMPLETE COMPARISON:  None. FINDINGS: Right Kidney: Length: 9.3 cm. Simple appearing cystic lesions demonstrated in the right kidney. Upper pole lesion measures 1.8 cm maximal diameter. Lower pole lesion measures 4 cm maximal diameter. No solid mass lesions identified. Renal parenchymal echotexture appears diffusely increased. Left Kidney: Length: 12.3 cm. Diffusely increased renal parenchymal echotexture. No solid mass or hydronephrosis identified. Bladder: No bladder wall thickening or filling defects. IMPRESSION: Echogenic renal parenchymal appearance bilaterally suggesting medical renal disease. Benign-appearing cysts in the right kidney. No hydronephrosis. Electronically  Signed   By: Burman Nieves M.D.   On: 05/24/2016 21:28     Scheduled Meds: . amLODipine  10 mg Oral Daily  . enoxaparin (LOVENOX) injection  30 mg Subcutaneous Q24H  . [START ON 05/27/2016] furosemide  40 mg Oral Daily  . hydrALAZINE  25 mg Oral Q8H  . labetalol  300 mg Oral TID  . pantoprazole  40 mg Oral Daily  . sodium chloride flush  3 mL Intravenous Q12H   Continuous Infusions:    LOS: 2 days   Time Spent in minutes   30 minutes  Rokia Bosket D.O. on 05/26/2016 at 10:27 AM  Between 7am to 7pm - Pager - [phone]  After 7pm go to [email or web address] - password [email or web address]  And look for the night coverage person covering for me after hours  [facility] Office  [phone]

## 2016-05-26 NOTE — Consult Note (Signed)
Patient name: [name] MRN: [id number] DOB: 1970/10/28 Sex: male  REASON FOR CONSULT: Evaluate for new hemodialysis access. Consult is from Dr. Allena Katz.   HPI: [name] is a 45 y.o. male, who was admitted 2 days ago with hypertension and a headache. He was admitted with a hypertensive emergency. His blood pressure is now under good control and his headaches have resolved. He was seen in consultation by the nephrologist and felt to have acute on chronic kidney disease. The etiology of this is not clear that I can tell. He denies any recent uremic symptoms. Specifically, he denies nausea, vomiting, fatigue, or anorexia.  He is right-handed.  Past Medical History:  Diagnosis Date  . HNP (herniated nucleus pulposus), lumbar   . Hypertension   . Kidney problem     Family History  Problem Relation Age of Onset  . Hypertension Mother   . Diabetes Mother   . COPD Father   . Hypertension Brother   There is no family history of premature cardiovascular disease.  SOCIAL HISTORY: Social History   Social History  . Marital status: Married    Spouse name: N/A  . Number of children: N/A  . Years of education: N/A   Occupational History  . Not on file.   Social History Main Topics  . Smoking status: Never Smoker  . Smokeless tobacco: Current User    Types: Chew  . Alcohol use Yes     Comment: occ  . Drug use: No  . Sexual activity: Not on file   Other Topics Concern  . Not on file   Social History Narrative  . No narrative on file    No Known Allergies  Current Facility-Administered Medications  Medication Dose Route Frequency Provider Last Rate Last Dose  . amLODipine (NORVASC) tablet 10 mg  10 mg Oral Daily Bobette Mo, MD   10 mg at 05/26/16 1023  . enoxaparin (LOVENOX) injection 30 mg  30 mg Subcutaneous Q24H Bobette Mo, MD   30 mg at 05/25/16 2120  . [START ON 05/27/2016] furosemide (LASIX) tablet 40 mg  40 mg Oral Daily Gwynn Burly, DO      . hydrALAZINE (APRESOLINE) injection 5-10 mg  5-10 mg Intravenous Q8H PRN Marcos Eke, PA-C   10 mg at 05/24/16 2131  . hydrALAZINE (APRESOLINE) tablet 25 mg  25 mg Oral Q8H [name], MD   25 mg at 05/26/16 0600  . HYDROcodone-acetaminophen (NORCO/VICODIN) 5-325 MG per tablet 1 tablet  1 tablet Oral Q4H PRN Bobette Mo, MD   1 tablet at 05/25/16 1442  . labetalol (NORMODYNE) tablet 300 mg  300 mg Oral TID Bobette Mo, MD   300 mg at 05/26/16 1023  . ondansetron (ZOFRAN) tablet 4 mg  4 mg Oral Q6H PRN Bobette Mo, MD       Or  . ondansetron [facility]) injection 4 mg  4 mg Intravenous Q6H PRN Bobette Mo, MD      . ondansetron [facility]) injection 4 mg  4 mg Intramuscular Q6H PRN Bobette Mo, MD   4 mg at 05/24/16 1943  . pantoprazole (PROTONIX) EC tablet 40 mg  40 mg Oral Daily Bobette Mo, MD   40 mg at 05/26/16 1023  . sodium chloride (OCEAN) 0.65 % nasal spray 1 spray  1 spray Each Nare PRN Bobette Mo, MD   1 spray at 05/25/16 0129  . sodium chloride flush (NS)  0.9 % injection 3 mL  3 mL Intravenous Q12H Bobette Mo, MD   3 mL at 05/25/16 2122    REVIEW OF SYSTEMS:  [X]  denotes positive finding, [ ]  denotes negative finding Cardiac  Comments:  Chest pain or chest pressure:    Shortness of breath upon exertion: X   Short of breath when lying flat:    Irregular heart rhythm:        Vascular    Pain in calf, thigh, or hip brought on by ambulation:    Pain in feet at night that wakes you up from your sleep:     Blood clot in your veins:    Leg swelling:         Pulmonary    Oxygen at home:    Productive cough:     Wheezing:         Neurologic    Sudden weakness in arms or legs:     Sudden numbness in arms or legs:     Sudden onset of difficulty speaking or slurred speech:    Temporary loss of vision in one eye:     Problems with dizziness:         Gastrointestinal    Blood in stool:     Vomited  blood:         Genitourinary    Burning when urinating:     Blood in urine:        Psychiatric    Major depression:         Hematologic    Bleeding problems:    Problems with blood clotting too easily:        Skin    Rashes or ulcers:        Constitutional    Fever or chills:      PHYSICAL EXAM: Vitals:   05/26/16 0400 05/26/16 0500 05/26/16 0600 05/26/16 0850  BP: 132/83 138/79 (!) 139/92 133/82  Pulse: 74 83 85   Resp: 13 14 14    Temp:    98.1 F (36.7 C)  TempSrc:    Oral  SpO2: 96% 94% 97%   Weight:      Height:        GENERAL: The patient is a well-nourished male, in no acute distress. The vital signs are documented above. CARDIAC: There is a regular rate and rhythm.  VASCULAR: I do not detect carotid bruits. He has palpable radial pulses bilaterally. He has palpable dorsalis pedis and posterior tibial pulses bilaterally. He has mild bilateral lower extremity edema. PULMONARY: There is good air exchange bilaterally without wheezing or rales. ABDOMEN: Soft and non-tender with normal pitched bowel sounds.  MUSCULOSKELETAL: There are no major deformities or cyanosis. NEUROLOGIC: No focal weakness or paresthesias are detected. SKIN: He has an IV  In the left antecubital fossa. PSYCHIATRIC: The patient has a normal affect.  DATA:   VEIN MAP: His vein mapping is pending.  Crt = 10.04, EGFR = 5  EKG yesterday shows NSR  CXR negative  MEDICAL ISSUES:  STAGE IV CHRONIC KIDNEY DISEASE: His vein map is pending. We have been asked to place access and I will try to schedule this for Thursday. I will not move his IV from the left arm to the right arm until I have seen the results of his vein mapping case the veins on the right are better. We have not been asked to place a catheter.  If he is not a candidate for a fistula  I will discuss the potential need for placing a graft with nephrology given his very low GFR.  I have explained the indications for placement of  an AV fistula or AV graft. I've explained that if at all possible we will place an AV fistula.  I have reviewed the risks of placement of an AV fistula including but not limited to: failure of the fistula to mature, need for subsequent interventions, and thrombosis. In addition I have reviewed the potential complications of placement of an AV graft. These risks include, but are not limited to, graft thrombosis, graft infection, wound healing problems, bleeding, arm swelling, and steal syndrome. All the patient's questions were answered and they are agreeable to proceed with surgery.   Waverly Ferrari [facility] [phone]

## 2016-05-26 NOTE — Progress Notes (Signed)
Right  Upper Extremity Vein Map    Cephalic  Segment Diameter Depth Comment  1. Axilla 4.207mm 6.501mm   2. Mid upper arm 3.207mm 4.166mm    3. Above AC 3.706mm 2.169mm   4. In AC 2.591mm 6.1030mm   5. Below AC mm mm   6. Mid forearm 3.704mm 4.675mm   7. Wrist mm mm Not visualized   Basilic  Segment Diameter Depth Comment  2. Mid upper arm 4mm 11.53mm   3. Above AC 3.454mm 3.586mm   4. In AC 3.592mm 1.365mm   5. Below AC 1.587mm 5.517mm   6. Mid forearm 1.788mm 2.85mm   7. Wrist 2.44mm 1.346mm

## 2016-05-27 ENCOUNTER — Inpatient Hospital Stay (HOSPITAL_COMMUNITY): Payer: BLUE CROSS/BLUE SHIELD

## 2016-05-27 DIAGNOSIS — I10 Essential (primary) hypertension: Secondary | ICD-10-CM | POA: Diagnosis not present

## 2016-05-27 LAB — ECHOCARDIOGRAM COMPLETE
HEIGHTINCHES: 73 in
Weight: 4155.23 oz

## 2016-05-27 LAB — CBC
HEMATOCRIT: 28.4 % — AB (ref 39.0–52.0)
Hemoglobin: 9.8 g/dL — ABNORMAL LOW (ref 13.0–17.0)
MCH: 29.2 pg (ref 26.0–34.0)
MCHC: 34.5 g/dL (ref 30.0–36.0)
MCV: 84.5 fL (ref 78.0–100.0)
PLATELETS: 215 10*3/uL (ref 150–400)
RBC: 3.36 MIL/uL — ABNORMAL LOW (ref 4.22–5.81)
RDW: 12.5 % (ref 11.5–15.5)
WBC: 6.7 10*3/uL (ref 4.0–10.5)

## 2016-05-27 LAB — BASIC METABOLIC PANEL
Anion gap: 12 (ref 5–15)
BUN: 69 mg/dL — AB (ref 6–20)
CALCIUM: 8.2 mg/dL — AB (ref 8.9–10.3)
CO2: 21 mmol/L — AB (ref 22–32)
CREATININE: 10.76 mg/dL — AB (ref 0.61–1.24)
Chloride: 102 mmol/L (ref 101–111)
GFR calc Af Amer: 6 mL/min — ABNORMAL LOW (ref >60)
GFR, EST NON AFRICAN AMERICAN: 5 mL/min — AB (ref >60)
GLUCOSE: 84 mg/dL (ref 65–99)
Potassium: 4.2 mmol/L (ref 3.5–5.1)
Sodium: 135 mmol/L (ref 135–145)

## 2016-05-27 LAB — GLOMERULAR BASEMENT MEMBRANE ANTIBODIES: GBM Ab: 3 units (ref 0–20)

## 2016-05-27 LAB — CK: Total CK: 109 U/L (ref 49–397)

## 2016-05-27 MED ORDER — ENOXAPARIN SODIUM 30 MG/0.3ML ~~LOC~~ SOLN
30.0000 mg | SUBCUTANEOUS | Status: DC
  Filled 2016-05-27: qty 0.3

## 2016-05-27 MED ORDER — ENOXAPARIN SODIUM 30 MG/0.3ML ~~LOC~~ SOLN: 30.0000 mg | SUBCUTANEOUS | Status: DC

## 2016-05-27 MED ORDER — DEXTROSE 5 % IV SOLN
1.5000 g | INTRAVENOUS | Status: AC
  Administered 2016-05-28: 1.5 g via INTRAVENOUS
  Filled 2016-05-27 (×2): qty 1.5

## 2016-05-27 NOTE — Progress Notes (Signed)
[facility] ROUNDING NOTE   Subjective:   Interval History:  No changes today feels better although creatinine is worse  Objective:  Vital signs in last 24 hours:  Temp:  [97.4 F (36.3 C)-98.7 F (37.1 C)] 98.2 F (36.8 C) (08/16 0725) Pulse Rate:  [72-89] 89 (08/16 1006) Resp:  [13-23] 13 (08/16 0725) BP: (116-143)/(67-96) 127/79 (08/16 1006) SpO2:  [94 %-99 %] 95 % (08/16 0725) Weight:  [117.8 kg (259 lb 11.2 oz)] 117.8 kg (259 lb 11.2 oz) (08/16 0646)  Weight change: -2 kg (-4 lb 6.6 oz) Filed Weights   05/24/16 2012 05/25/16 2337 05/27/16 0646  Weight: 121.1 kg (267 lb) 119.8 kg (264 lb 1.8 oz) 117.8 kg (259 lb 11.2 oz)    Intake/Output: I/O last 3 completed shifts: In: 780 [P.O.:780] Out: 3890 [Urine:3890]   Intake/Output this shift:  Total I/O In: 243 [P.O.:240; I.V.:3] Out: -   CVS- RRR RS- CTA ABD- BS present soft non-distended EXT- no edema   Basic Metabolic Panel:  Recent Labs Lab 05/24/16 1600 05/25/16 0248 05/26/16 0350 05/27/16 0332  NA 138 136 134* 135  K 5.1 4.4 4.2 4.2  CL 107 104 101 102  CO2 20* 22 22 21*  GLUCOSE 92 105* 96 84  BUN 57* 54* 61* 69*  CREATININE 9.30* 9.20* 10.04* 10.76*  CALCIUM 8.3* 8.5* 8.3* 8.2*    Liver Function Tests:  Recent Labs Lab 05/25/16 0248  AST 13*  ALT 12*  ALKPHOS 63  BILITOT 0.2*  PROT 5.2*  ALBUMIN 2.6*   No results for input(s): LIPASE, AMYLASE in the last 168 hours. No results for input(s): AMMONIA in the last 168 hours.  CBC:  Recent Labs Lab 05/24/16 1600 05/24/16 2031 05/25/16 0248 05/26/16 0350 05/27/16 0332  WBC 7.8 8.4 7.4 6.4 6.7  HGB 11.4* 11.6* 10.6* 9.8* 9.8*  HCT 31.7* 33.3* 30.2* 28.4* 28.4*  MCV 82.3 83.5 83.4 83.3 84.5  PLT 250 252 212 226 215    Cardiac Enzymes:  Recent Labs Lab 05/27/16 0807  CKTOTAL 109    BNP: Invalid input(s): POCBNP  CBG: No results for input(s): GLUCAP in the last 168 hours.  Microbiology: Results for  orders placed or performed during the hospital encounter of 05/24/16  MRSA PCR Screening     Status: None   Collection Time: 05/24/16  8:14 PM  Result Value Ref Range Status   MRSA by PCR NEGATIVE NEGATIVE Final    Comment:        The GeneXpert MRSA Assay (FDA approved for NASAL specimens only), is one component of a comprehensive MRSA colonization surveillance program. It is not intended to diagnose MRSA infection nor to guide or monitor treatment for MRSA infections.     Coagulation Studies: No results for input(s): LABPROT, INR in the last 72 hours.  Urinalysis:  Recent Labs  05/24/16 1657  COLORURINE YELLOW  LABSPEC 1.015  PHURINE 5.5  GLUCOSEU NEGATIVE  HGBUR LARGE*  BILIRUBINUR NEGATIVE  KETONESUR NEGATIVE  PROTEINUR >300*  NITRITE NEGATIVE  LEUKOCYTESUR NEGATIVE      Imaging: No results found.   Medications:     . amLODipine  10 mg Oral Daily  . [START ON 05/28/2016] cefUROXime (ZINACEF)  IV  1.5 g Intravenous On Call to OR  . [START ON 05/28/2016] enoxaparin (LOVENOX) injection  30 mg Subcutaneous Q24H  . furosemide  40 mg Oral Daily  . hydrALAZINE  25 mg Oral Q8H  . labetalol  300 mg Oral TID  .  pantoprazole  40 mg Oral Daily  . sodium chloride flush  3 mL Intravenous Q12H   hydrALAZINE, HYDROcodone-acetaminophen, ondansetron **OR** ondansetron (ZOFRAN) IV, ondansetron, sodium chloride  Assessment/ Plan:  1. Acute on CKD - Prior Creatinine 2.23, Prot/Cr ratio 4169 in January 2017. There is a creatinine of 3.9  6 weeks ago. Patient has uncontrolled HTN, proteinuria and nephritic-appearing sediment. 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 Also could have malignant HTN with TMA. This would need a renal biopsy to diagnose . SCr continues to rise. Not uremic, no urgent indication for HD right now. Anticipate he will ultimately need hemodialysis with progression of CKD. He has a fistula to be placed in AM   Discussed the probability of this with patient and will proceed with evaluation of shunt placement. -serologies pending (ANA, SPEP, kappa/lamda light chains, Mpo/pr-3, anti-GBM) -oral Lasix 40 mg daily -Monitor renal function -SCr 9.30 >> 9.20 >> 10.04 >> 10.79 -No renal labwork on file per discussion with Dr. Rayford Halsted office - Fistula in AM  2. Hypertensive urgency - on po labetalol, po norvasc, po hydralazine and po lasix. BPs stable. 3. Hx back surgery 4. Vol excess - improving, adjust lasix as above      LOS: 3 Ariell Gunnels W @TODAY @10 :18 AM

## 2016-05-27 NOTE — Progress Notes (Signed)
PROGRESS NOTE    [name]  VQQ:[id number] DOB: [birth year] DOA: 05/24/2016 PCP: Nonnie Done., MD   Chief Complaint  Patient presents with  . Headache    with Hypertension    Brief Narrative:  HPI on 05/24/2016 by Dr. Sanda Klein [name] is a 45 y.o. male with medical history significant of hypertension, back surgery is coming to the emergency department due to severe headache since Wednesday.  Per patient, he has been having trouble with his blood pressure control. He has had a history of hypertension for about 5 years and was taken losartan/hydrochlorothiazide 50/12.5 mg by mouth daily until about 3 months ago, when he was doubled to 100/25 mg to obtain a better BP control. About 5 weeks ago, the patient was started on Norvasc 5 mg by mouth daily by his PCP. He states that he discontinued the losartan/HCTZ, since he apparently understood that he was supposed to change to amlodipine only for treatment, instead of adding it to the current therapy.   On Wednesday, the patient started having significant frontal and occipital headaches associated with nausea and photophobia, minimally relieved by aspirin 650 mg or ibuprofen 400 mg by mouth several times a day. He also restarted his Losartan/HCTZ 100/25 mg Wednesday. However, he states that the headaches and poor blood pressure control continued since then. He states, that this morning he took 2 tablets of Sudafed for his headache, thinking that he was a sinus headache, without any relief. At that point, he decided to come to the emergency department for evaluation. He denies chest pain, palpitations, dizziness, diaphoresis, dyspnea, PND or orthopnea, but complains of edema of feet and ankles and hands. He denies fever, chills, productive cough, abdominal pain, constipation, melena or hematochezia. He had an episode of diarrhea earlier today. He denies dysuria, oliguria, hematuria or urinary frequency.  Interim  history Nephrology consulted, creatinine worsening despite lasix, ?renal biopsy. Vascular consulted for access.  Assessment & Plan   Acute on chronic kidney disease unknown stage -Likely secondary to multifactorial causes: NSAID use, HTN -Upon admission, creatinine 9.3, last Creatinine in the system 0.97 (2013), however per patient, last known creatinine around 2 -Pending records from PCP and nephrologist, Dr. Azzie Almas -Currently Creatinine 10.76 -Not placed on IVF due to lower ext edema and possible volume overload -Given lasix 80mg  IV q8h- transitioned to PO lasix 40mg  daily -Monitor intake/output and fluid restriction. UOP over past 24 hrs: 2330cc -Renal US: no hydronephrosis, echogenic renal parenchymal appearance B/L- medical renal disease -Nephrology consulted and appreciated -Complement CH50: >60,  C3/4 WNL, RF neg, Hep panel negative, HIV nonreactive, ANA neg, MPO neg -Kappa chains 104.3, Lambda 80.4 (both elevated) -Spoke with Dr. Hyman Hopes, will order [id number], renal biopsy -Vascular surgery consulted for access, patient did have vein mapping.   Accelerated hypertension/Unc HTN -Due to confusion about medication regimen -Upon admission, BP 191/119 -Continue labetalol, amlodipine, hydralazine, IV lasix -BP improving  Lower extremity edema/volume overload -Likely secondary to kidney injury -Mildly elevated BNP upon admission 108 -Echocardiogram pending   Normocytic Anemia/Anemia of chronic disease -hemoglobin currently 9.8 -Continue to monitor CBC  Headache -Resolved, Possible secondary to the above -CT head: normal -Continue pain control  History of back surgery  DVT Prophylaxis  Lovenox  Code Status: Full  Family Communication: Wife at bedside  Disposition Plan: Admitted, pending further workup and vascular access  Consultants Nephrology  Vascular surgery Interventional Radiology  Procedures  Renal US Upper ext vein mapping  Antibiotics    Anti-infectives  Start     Dose/Rate Route Frequency Ordered Stop   05/28/16 0600  cefUROXime (ZINACEF) 1.5 g in dextrose 5 % 50 mL IVPB     1.5 g 100 mL/hr over 30 Minutes Intravenous On call to O.R. 05/27/16 0806 05/29/16 0559      Subjective:   [name] seen and examined today. Worries about starting dialysis.  Cannot understand what caused his kidneys to get worse.  Patient denies further headache. Denies chest pain, shortness of breath, abdominal pain, nausea, vomiting, dizziness, visual changes.   Objective:   Vitals:   05/27/16 0408 05/27/16 0636 05/27/16 0646 05/27/16 0725  BP: (!) 142/83 133/83  (!) 143/91  Pulse: 84 81  81  Resp: 14   13  Temp: 98.4 F (36.9 C)   98.2 F (36.8 C)  TempSrc: Oral   Oral  SpO2: 94%   95%  Weight:   117.8 kg (259 lb 11.2 oz)   Height:        Intake/Output Summary (Last 24 hours) at 05/27/16 5784 Last data filed at 05/27/16 0600  Gross per 24 hour  Intake              540 ml  Output             2330 ml  Net            -1790 ml   Filed Weights   05/24/16 2012 05/25/16 2337 05/27/16 0646  Weight: 121.1 kg (267 lb) 119.8 kg (264 lb 1.8 oz) 117.8 kg (259 lb 11.2 oz)    Exam  General: Well developed, well nourished, NAD, appears stated age  HEENT: NCAT, mucous membranes moist.   Cardiovascular: S1 S2 auscultated, RRR  Respiratory: Clear to auscultation bilaterally with equal chest rise  Abdomen: Soft, obese, nontender, nondistended, + bowel sounds  Extremities: warm dry without cyanosis clubbing. +1LE edema B/L  Neuro: AAOx3, nonfocal  Psych: Anxious but appropriate,pleasant   Data Reviewed: I have personally reviewed following labs and imaging studies  CBC:  Recent Labs Lab 05/24/16 1600 05/24/16 2031 05/25/16 0248 05/26/16 0350 05/27/16 0332  WBC 7.8 8.4 7.4 6.4 6.7  HGB 11.4* 11.6* 10.6* 9.8* 9.8*  HCT 31.7* 33.3* 30.2* 28.4* 28.4*  MCV 82.3 83.5 83.4 83.3 84.5  PLT 250 252 212 226 215    Basic Metabolic Panel:  Recent Labs Lab 05/24/16 1600 05/25/16 0248 05/26/16 0350 05/27/16 0332  NA 138 136 134* 135  K 5.1 4.4 4.2 4.2  CL 107 104 101 102  CO2 20* 22 22 21*  GLUCOSE 92 105* 96 84  BUN 57* 54* 61* 69*  CREATININE 9.30* 9.20* 10.04* 10.76*  CALCIUM 8.3* 8.5* 8.3* 8.2*   GFR: Estimated Creatinine Clearance: 11.7 mL/min (by C-G formula based on SCr of 10.76 mg/dL). Liver Function Tests:  Recent Labs Lab 05/25/16 0248  AST 13*  ALT 12*  ALKPHOS 63  BILITOT 0.2*  PROT 5.2*  ALBUMIN 2.6*   No results for input(s): LIPASE, AMYLASE in the last 168 hours. No results for input(s): AMMONIA in the last 168 hours. Coagulation Profile: No results for input(s): INR, PROTIME in the last 168 hours. Cardiac Enzymes: No results for input(s): CKTOTAL, CKMB, CKMBINDEX, TROPONINI in the last 168 hours. BNP (last 3 results) No results for input(s): PROBNP in the last 8760 hours. HbA1C: No results for input(s): HGBA1C in the last 72 hours. CBG: No results for input(s): GLUCAP in the last 168 hours. Lipid Profile:  Recent Labs  05/25/16 0248  CHOL 203*  HDL 30*  LDLCALC 124*  TRIG 245*  CHOLHDL 6.8   Thyroid Function Tests: No results for input(s): TSH, T4TOTAL, FREET4, T3FREE, THYROIDAB in the last 72 hours. Anemia Panel: No results for input(s): VITAMINB12, FOLATE, FERRITIN, TIBC, IRON, RETICCTPCT in the last 72 hours. Urine analysis:    Component Value Date/Time   COLORURINE YELLOW 05/24/2016 1657   APPEARANCEUR HAZY (A) 05/24/2016 1657   LABSPEC 1.015 05/24/2016 1657   PHURINE 5.5 05/24/2016 1657   GLUCOSEU NEGATIVE 05/24/2016 1657   HGBUR LARGE (A) 05/24/2016 1657   BILIRUBINUR NEGATIVE 05/24/2016 1657   KETONESUR NEGATIVE 05/24/2016 1657   PROTEINUR >300 (A) 05/24/2016 1657   UROBILINOGEN 0.2 01/19/2012 1116   NITRITE NEGATIVE 05/24/2016 1657   LEUKOCYTESUR NEGATIVE 05/24/2016 1657   Sepsis  Labs: @LABRCNTIP (procalcitonin:4,lacticidven:4)  ) Recent Results (from the past 240 hour(s))  MRSA PCR Screening     Status: None   Collection Time: 05/24/16  8:14 PM  Result Value Ref Range Status   MRSA by PCR NEGATIVE NEGATIVE Final    Comment:        The GeneXpert MRSA Assay (FDA approved for NASAL specimens only), is one component of a comprehensive MRSA colonization surveillance program. It is not intended to diagnose MRSA infection nor to guide or monitor treatment for MRSA infections.       Radiology Studies: No results found.   Scheduled Meds: . amLODipine  10 mg Oral Daily  . [START ON 05/28/2016] cefUROXime (ZINACEF)  IV  1.5 g Intravenous On Call to OR  . enoxaparin (LOVENOX) injection  30 mg Subcutaneous Q24H  . furosemide  40 mg Oral Daily  . hydrALAZINE  25 mg Oral Q8H  . labetalol  300 mg Oral TID  . pantoprazole  40 mg Oral Daily  . sodium chloride flush  3 mL Intravenous Q12H   Continuous Infusions:    LOS: 3 days   Time Spent in minutes   30 minutes  Serra Younan D.O. on 05/27/2016 at 8:14 AM  Between 7am to 7pm - Pager - [phone]  After 7pm go to [email or web address] - password [email or web address]  And look for the night coverage person covering for me after hours  [facility] Office  [phone]

## 2016-05-27 NOTE — Progress Notes (Signed)
   VASCULAR SURGERY ASSESSMENT & PLAN:  I have reviewed his vein map. He appears to be a good candidate for a left brachiocephalic AV fistula. I will have the IV moved from the left arm to the right arm. We have are discussed procedure potential complications and he is agreeable to proceed. This is scheduled with Dr. Cain tomorrow.   SUBRandie HeinzJECTIVE: No complaints  PHYSICAL EXAM: Vitals:   05/27/16 0408 05/27/16 0636 05/27/16 0646 05/27/16 0725  BP: (!) 142/83 133/83  (!) 143/91  Pulse: 84 81  81  Resp: 14   13  Temp: 98.4 F (36.9 C)   98.2 F (36.8 C)  TempSrc: Oral   Oral  SpO2: 94%   95%  Weight:   259 lb 11.2 oz (117.8 kg)   Height:       Palpable left radial pulse. IV in left antecubital fossa  LABS: Lab Results  Component Value Date   WBC 6.7 05/27/2016   HGB 9.8 (L) 05/27/2016   HCT 28.4 (L) 05/27/2016   MCV 84.5 05/27/2016   PLT 215 05/27/2016   Lab Results  Component Value Date   CREATININE 10.76 (H) 05/27/2016   Principal Problem:   Hypertensive emergency Active Problems:   AKI (acute kidney injury) (HCC)   Anemia   Headache  Cari CarawayChris Dickson Beeper: [phone] 05/27/2016

## 2016-05-27 NOTE — Plan of Care (Signed)
Problem: Education: Goal: Knowledge of [facility] Education information/materials will improve Outcome: Progressing Patient updated on POC and agreeable to have fistula placed 8/17 and renal us with biopsy 8/18.   Problem: Safety: Goal: Ability to remain free from injury will improve Outcome: Completed/Met Date Met: 05/27/16 Patient independent in hospital, but continues to call for assistance before leaving his room to walk halls. NO risk for injury.  Problem: Pain Managment: Goal: General experience of comfort will improve Outcome: Progressing Patient aware of how to call for pain concerns, however, he did not complain of any pain during my shift.  Problem: Nutritional: Goal: Ability to make appriopriate dietary choices will improve Outcome: Progressing Patient voices interest in dietary consult to get information about his diet changes after discharge. Patient also agreeable to diet in hospital and compliant with fluid restrictions at this time.    

## 2016-05-27 NOTE — Consult Note (Signed)
Chief Complaint: ARF  Referring Physician:Dr. Edrick Oh  Supervising Physician: Corrie Mckusick  Patient Status: In-pt   HPI: [name] is an 45 y.o. male who was admitted secondary to a severe HA he had had for several days.  On arrival he was noted to have accelerated HTN and ARF.  It was determined he had a mix up with his HTN meds with his PCP.  He was supposed to add a medicine and he thought he was supposed to stop it.  Therefore, he had the uncontrolled HTN, which is thought to be the etiology of his renal failure for the most part.  Dr. Justin Mend has evaluated him and has requested a renal biopsy to rule out any other possible etiology of his renal failure.  Past Medical History:  Past Medical History:  Diagnosis Date  . HNP (herniated nucleus pulposus), lumbar   . Hypertension   . Kidney problem     Past Surgical History:  Past Surgical History:  Procedure Laterality Date  . BACK SURGERY    . HERNIA REPAIR  2009  . REFRACTIVE SURGERY      Family History:  Family History  Problem Relation Age of Onset  . Hypertension Mother   . Diabetes Mother   . COPD Father   . Hypertension Brother     Social History:  reports that he has never smoked. His smokeless tobacco use includes Chew. He reports that he drinks alcohol. He reports that he does not use drugs.  Allergies: No Known Allergies  Medications: Medications reviewed in Epic  Please HPI for pertinent positives, otherwise complete 10 system ROS negative.  Mallampati Score: MD Evaluation Airway: WNL Heart: WNL Abdomen: WNL Chest/ Lungs: WNL ASA  Classification: 2 Mallampati/Airway Score: Two  Physical Exam: BP (!) 143/91   Pulse 81   Temp 98.2 F (36.8 C) (Oral)   Resp 13   Ht 6' 1" (1.854 m)   Wt 259 lb 11.2 oz (117.8 kg)   SpO2 95%   BMI 34.26 kg/m  Body mass index is 34.26 kg/m. General: pleasant, WD, WN white male who is laying in bed in NAD HEENT: head is normocephalic,  atraumatic.  Sclera are noninjected.  PERRL.  Ears and nose without any masses or lesions.  Mouth is pink and moist Heart: regular, rate, and rhythm.  Normal s1,s2. No obvious murmurs, gallops, or rubs noted.  Palpable radial and pedal pulses bilaterally Lungs: CTAB, no wheezes, rhonchi, or rales noted.  Respiratory effort nonlabored Abd: soft, NT, ND, +BS, no masses, hernias, or organomegaly MS: all 4 extremities are symmetrical with no cyanosis, clubbing.  Trace edema in lower extremities Psych: A&Ox3 with an appropriate affect.   Labs: Results for orders placed or performed during the hospital encounter of 05/24/16 (from the past 48 hour(s))  CBC     Status: Abnormal   Collection Time: 05/26/16  3:50 AM  Result Value Ref Range   WBC 6.4 4.0 - 10.5 K/uL   RBC 3.41 (L) 4.22 - 5.81 MIL/uL   Hemoglobin 9.8 (L) 13.0 - 17.0 g/dL   HCT 28.4 (L) 39.0 - 52.0 %   MCV 83.3 78.0 - 100.0 fL   MCH 28.7 26.0 - 34.0 pg   MCHC 34.5 30.0 - 36.0 g/dL   RDW 12.5 11.5 - 15.5 %   Platelets 226 150 - 400 K/uL  Basic metabolic panel     Status: Abnormal   Collection Time: 05/26/16  3:50 AM  Result  Value Ref Range   Sodium 134 (L) 135 - 145 mmol/L   Potassium 4.2 3.5 - 5.1 mmol/L   Chloride 101 101 - 111 mmol/L   CO2 22 22 - 32 mmol/L   Glucose, Bld 96 65 - 99 mg/dL   BUN 61 (H) 6 - 20 mg/dL   Creatinine, Ser 10.04 (H) 0.61 - 1.24 mg/dL   Calcium 8.3 (L) 8.9 - 10.3 mg/dL   GFR calc non Af Amer 5 (L) >60 mL/min   GFR calc Af Amer 6 (L) >60 mL/min    Comment: (NOTE) The eGFR has been calculated using the CKD EPI equation. This calculation has not been validated in all clinical situations. eGFR's persistently <60 mL/min signify possible Chronic Kidney Disease.    Anion gap 11 5 - 15  CBC     Status: Abnormal   Collection Time: 05/27/16  3:32 AM  Result Value Ref Range   WBC 6.7 4.0 - 10.5 K/uL   RBC 3.36 (L) 4.22 - 5.81 MIL/uL   Hemoglobin 9.8 (L) 13.0 - 17.0 g/dL   HCT 28.4 (L) 39.0 - 52.0 %     MCV 84.5 78.0 - 100.0 fL   MCH 29.2 26.0 - 34.0 pg   MCHC 34.5 30.0 - 36.0 g/dL   RDW 12.5 11.5 - 15.5 %   Platelets 215 150 - 400 K/uL  Basic metabolic panel     Status: Abnormal   Collection Time: 05/27/16  3:32 AM  Result Value Ref Range   Sodium 135 135 - 145 mmol/L   Potassium 4.2 3.5 - 5.1 mmol/L   Chloride 102 101 - 111 mmol/L   CO2 21 (L) 22 - 32 mmol/L   Glucose, Bld 84 65 - 99 mg/dL   BUN 69 (H) 6 - 20 mg/dL   Creatinine, Ser 10.76 (H) 0.61 - 1.24 mg/dL   Calcium 8.2 (L) 8.9 - 10.3 mg/dL   GFR calc non Af Amer 5 (L) >60 mL/min   GFR calc Af Amer 6 (L) >60 mL/min    Comment: (NOTE) The eGFR has been calculated using the CKD EPI equation. This calculation has not been validated in all clinical situations. eGFR's persistently <60 mL/min signify possible Chronic Kidney Disease.    Anion gap 12 5 - 15  CK     Status: None   Collection Time: 05/27/16  8:07 AM  Result Value Ref Range   Total CK 109 49 - 397 U/L    Imaging: No results found.  Assessment/Plan 1. Acute renal failure -Dr. Webb would like for his biopsy to be done on Friday as he is going to the OR tomorrow for vascular to create a graft/fistula for him. -NPO p MN for Friday, orders written.  No Lovenox tomorrow night of Friday until after the procedure.  Orders written for that as well. -Risks and Benefits discussed with the patient including, but not limited to bleeding, infection, damage to adjacent structures or low yield requiring additional tests. All of the patient's questions were answered, patient is agreeable to proceed. Consent signed and in chart.  Thank you for this interesting consult.  I greatly enjoyed meeting [name] and look forward to participating in their care.  A copy of this report was sent to the requesting provider on this date.  Electronically Signed: OSBORNE,KELLY E 05/27/2016, 9:41 AM   I spent a total of 40 Minutes  in face to face in clinical consultation,  greater than 50% of which was counseling/coordinating   care for acute renal failure   

## 2016-05-28 ENCOUNTER — Inpatient Hospital Stay (HOSPITAL_COMMUNITY): Payer: BLUE CROSS/BLUE SHIELD | Admitting: Certified Registered Nurse Anesthetist

## 2016-05-28 ENCOUNTER — Encounter (HOSPITAL_COMMUNITY)
Admission: EM | Disposition: A | Discharge status: Home / Self Care | Payer: Self-pay | Source: Home / Self Care | Attending: Internal Medicine

## 2016-05-28 ENCOUNTER — Encounter (HOSPITAL_COMMUNITY): Payer: Self-pay | Admitting: Certified Registered Nurse Anesthetist

## 2016-05-28 ENCOUNTER — Inpatient Hospital Stay (HOSPITAL_COMMUNITY): Payer: BLUE CROSS/BLUE SHIELD

## 2016-05-28 HISTORY — PX: AV FISTULA PLACEMENT: SHX1204

## 2016-05-28 LAB — BASIC METABOLIC PANEL
ANION GAP: 14 (ref 5–15)
BUN: 71 mg/dL — ABNORMAL HIGH (ref 6–20)
CO2: 21 mmol/L — ABNORMAL LOW (ref 22–32)
Calcium: 8.3 mg/dL — ABNORMAL LOW (ref 8.9–10.3)
Chloride: 103 mmol/L (ref 101–111)
Creatinine, Ser: 11.56 mg/dL — ABNORMAL HIGH (ref 0.61–1.24)
GFR calc Af Amer: 5 mL/min — ABNORMAL LOW (ref >60)
GFR, EST NON AFRICAN AMERICAN: 5 mL/min — AB (ref >60)
GLUCOSE: 89 mg/dL (ref 65–99)
POTASSIUM: 4.4 mmol/L (ref 3.5–5.1)
SODIUM: 138 mmol/L (ref 135–145)

## 2016-05-28 LAB — ADAMTS13 ACTIVITY: ADAMTS 13 ACTIVITY: 88.8 % (ref >66.8)

## 2016-05-28 LAB — GLUCOSE, CAPILLARY: GLUCOSE-CAPILLARY: 87 mg/dL (ref 65–99)

## 2016-05-28 LAB — CBC
HCT: 28.6 % — ABNORMAL LOW (ref 39.0–52.0)
HEMOGLOBIN: 9.9 g/dL — AB (ref 13.0–17.0)
MCH: 29.5 pg (ref 26.0–34.0)
MCHC: 34.6 g/dL (ref 30.0–36.0)
MCV: 85.1 fL (ref 78.0–100.0)
PLATELETS: 237 10*3/uL (ref 150–400)
RBC: 3.36 MIL/uL — AB (ref 4.22–5.81)
RDW: 12.6 % (ref 11.5–15.5)
WBC: 7.7 10*3/uL (ref 4.0–10.5)

## 2016-05-28 LAB — SURGICAL PCR SCREEN
MRSA, PCR: NEGATIVE
Staphylococcus aureus: NEGATIVE

## 2016-05-28 LAB — PROTIME-INR
INR: 1.13
Prothrombin Time: 14.6 seconds (ref 11.4–15.2)

## 2016-05-28 LAB — PHOSPHORUS: Phosphorus: 8.1 mg/dL — ABNORMAL HIGH (ref 2.5–4.6)

## 2016-05-28 LAB — MAGNESIUM: Magnesium: 1.9 mg/dL (ref 1.7–2.4)

## 2016-05-28 LAB — HEMOGLOBIN A1C
Hgb A1c MFr Bld: 4.7 % — ABNORMAL LOW (ref 4.8–5.6)
MEAN PLASMA GLUCOSE: 88 mg/dL

## 2016-05-28 LAB — ADAMTS13 ACTIVITY REFLEX

## 2016-05-28 SURGERY — ARTERIOVENOUS (AV) FISTULA CREATION
Anesthesia: Monitor Anesthesia Care | Site: Arm Lower | Laterality: Left

## 2016-05-28 MED ORDER — ACETAMINOPHEN 160 MG/5ML PO SOLN
325.0000 mg | ORAL | Status: DC | PRN
  Filled 2016-05-28: qty 20.3

## 2016-05-28 MED ORDER — PROPOFOL 10 MG/ML IV BOLUS
INTRAVENOUS | Status: DC | PRN
  Administered 2016-05-28: 30 mg via INTRAVENOUS

## 2016-05-28 MED ORDER — SODIUM CHLORIDE 0.9 % IV SOLN
INTRAVENOUS | Status: DC | PRN
  Administered 2016-05-28 (×2): via INTRAVENOUS

## 2016-05-28 MED ORDER — ACETAMINOPHEN 325 MG PO TABS: 325.0000 mg | ORAL_TABLET | ORAL | Status: DC | PRN

## 2016-05-28 MED ORDER — PROPOFOL 10 MG/ML IV BOLUS
INTRAVENOUS | Status: AC
  Filled 2016-05-28: qty 20

## 2016-05-28 MED ORDER — FENTANYL CITRATE (PF) 100 MCG/2ML IJ SOLN
INTRAMUSCULAR | Status: DC | PRN
  Administered 2016-05-28 (×2): 50 ug via INTRAVENOUS

## 2016-05-28 MED ORDER — SODIUM CHLORIDE 0.9 % IV SOLN
INTRAVENOUS | Status: DC | PRN
  Administered 2016-05-28: 500 mL

## 2016-05-28 MED ORDER — LIDOCAINE 2% (20 MG/ML) 5 ML SYRINGE
INTRAMUSCULAR | Status: AC
  Filled 2016-05-28: qty 5

## 2016-05-28 MED ORDER — OXYCODONE HCL 5 MG/5ML PO SOLN: 5.0000 mg | Freq: Once | ORAL | Status: DC | PRN

## 2016-05-28 MED ORDER — MIDAZOLAM HCL 2 MG/2ML IJ SOLN
INTRAMUSCULAR | Status: AC
  Filled 2016-05-28: qty 2

## 2016-05-28 MED ORDER — OXYCODONE HCL 5 MG PO TABS: 5.0000 mg | ORAL_TABLET | Freq: Once | ORAL | Status: DC | PRN

## 2016-05-28 MED ORDER — FENTANYL CITRATE (PF) 100 MCG/2ML IJ SOLN
INTRAMUSCULAR | Status: AC
  Filled 2016-05-28: qty 4

## 2016-05-28 MED ORDER — FENTANYL CITRATE (PF) 100 MCG/2ML IJ SOLN: 25.0000 ug | INTRAMUSCULAR | Status: DC | PRN

## 2016-05-28 MED ORDER — 0.9 % SODIUM CHLORIDE (POUR BTL) OPTIME
TOPICAL | Status: DC | PRN
  Administered 2016-05-28: 1000 mL

## 2016-05-28 MED ORDER — LIDOCAINE-EPINEPHRINE (PF) 1 %-1:200000 IJ SOLN
INTRAMUSCULAR | Status: DC | PRN
  Administered 2016-05-28: 12 mL

## 2016-05-28 MED ORDER — LIDOCAINE HCL (PF) 1 % IJ SOLN
INTRAMUSCULAR | Status: AC
  Filled 2016-05-28: qty 30

## 2016-05-28 MED ORDER — SODIUM CHLORIDE 0.9 % IV SOLN
INTRAVENOUS | Status: DC
  Administered 2016-05-28: 08:00:00 via INTRAVENOUS

## 2016-05-28 MED ORDER — LIDOCAINE-EPINEPHRINE (PF) 1 %-1:200000 IJ SOLN
INTRAMUSCULAR | Status: AC
  Filled 2016-05-28: qty 30

## 2016-05-28 MED ORDER — PROPOFOL 500 MG/50ML IV EMUL
INTRAVENOUS | Status: DC | PRN
  Administered 2016-05-28: 75 ug/kg/min via INTRAVENOUS

## 2016-05-28 MED ORDER — LIDOCAINE HCL (CARDIAC) 20 MG/ML IV SOLN
INTRAVENOUS | Status: DC | PRN
  Administered 2016-05-28: 100 mg via INTRAVENOUS

## 2016-05-28 SURGICAL SUPPLY — 33 items
ARMBAND PINK RESTRICT EXTREMIT (MISCELLANEOUS) ×3 IMPLANT
CANISTER SUCTION 2500CC (MISCELLANEOUS) ×3 IMPLANT
CLIP TI MEDIUM 6 (CLIP) ×3 IMPLANT
CLIP TI WIDE RED SMALL 6 (CLIP) ×6 IMPLANT
COVER PROBE W GEL 5X96 (DRAPES) ×3 IMPLANT
ELECT REM PT RETURN 9FT ADLT (ELECTROSURGICAL) ×3
ELECTRODE REM PT RTRN 9FT ADLT (ELECTROSURGICAL) ×1 IMPLANT
GLOVE BIO SURGEON STRL SZ7.5 (GLOVE) ×3 IMPLANT
GLOVE BIOGEL PI IND STRL 6.5 (GLOVE) ×1 IMPLANT
GLOVE BIOGEL PI IND STRL 7.5 (GLOVE) ×1 IMPLANT
GLOVE BIOGEL PI IND STRL 8 (GLOVE) ×1 IMPLANT
GLOVE BIOGEL PI INDICATOR 6.5 (GLOVE) ×2
GLOVE BIOGEL PI INDICATOR 7.5 (GLOVE) ×2
GLOVE BIOGEL PI INDICATOR 8 (GLOVE) ×2
GLOVE ECLIPSE 7.0 STRL STRAW (GLOVE) ×3 IMPLANT
GLOVE SURG SS PI 7.0 STRL IVOR (GLOVE) ×3 IMPLANT
GOWN STRL REUS W/ TWL LRG LVL3 (GOWN DISPOSABLE) ×1 IMPLANT
GOWN STRL REUS W/ TWL XL LVL3 (GOWN DISPOSABLE) ×2 IMPLANT
GOWN STRL REUS W/TWL LRG LVL3 (GOWN DISPOSABLE) ×2
GOWN STRL REUS W/TWL XL LVL3 (GOWN DISPOSABLE) ×4
HEMOSTAT SNOW SURGICEL 2X4 (HEMOSTASIS) IMPLANT
KIT BASIN OR (CUSTOM PROCEDURE TRAY) ×3 IMPLANT
KIT ROOM TURNOVER OR (KITS) ×3 IMPLANT
LIQUID BAND (GAUZE/BANDAGES/DRESSINGS) ×3 IMPLANT
NS IRRIG 1000ML POUR BTL (IV SOLUTION) ×3 IMPLANT
PACK CV ACCESS (CUSTOM PROCEDURE TRAY) ×3 IMPLANT
PAD ARMBOARD 7.5X6 YLW CONV (MISCELLANEOUS) ×6 IMPLANT
SUT MNCRL AB 4-0 PS2 18 (SUTURE) ×3 IMPLANT
SUT PROLENE 6 0 BV (SUTURE) ×3 IMPLANT
SUT VIC AB 3-0 SH 27 (SUTURE) ×2
SUT VIC AB 3-0 SH 27X BRD (SUTURE) ×1 IMPLANT
UNDERPAD 30X30 INCONTINENT (UNDERPADS AND DIAPERS) ×3 IMPLANT
WATER STERILE IRR 1000ML POUR (IV SOLUTION) IMPLANT

## 2016-05-28 NOTE — Op Note (Signed)
    OPERATIVE NOTE   PROCEDURE: left brachiocephalic arteriovenous fistula placement  PRE-OPERATIVE DIAGNOSIS: ckd  POST-OPERATIVE DIAGNOSIS: same  SURGEON: Gerrald Basu C. Randie Heinzain, MD  ASSISTANT(S): Lianne CureMaureen Collins, PA  ANESTHESIA: local and MAC  ESTIMATED BLOOD LOSS: 20 cc  FINDING(S): Suitable vein and artery for avf  SPECIMEN(S):  none  INDICATIONS:   [name] is a 45 y.o. male who presents with ckd.  The patient is scheduled for left brachiocephalic arteriovenous fistula placement.  The patient is aware the risks include but are not limited to: bleeding, infection, steal syndrome, nerve damage, ischemic monomelic neuropathy, failure to mature, and need for additional procedures.  The patient is aware of the risks of the procedure and elects to proceed forward.  DESCRIPTION: After full informed written consent was obtained from the patient, the patient was brought back to the operating room and placed supine upon the operating table.  Prior to induction, the patient received IV antibiotics.   After obtaining adequate anesthesia, the patient was then prepped and draped in the standard fashion for a left arm access procedure.  I turned my attention first to identifying the patient's cephalic vein and brachial artery.  Using SonoSite guidance, the location of these vessels were marked out on the skin.   At this point, I injected local anesthetic to obtain a field block of the antecubitum. I made a transverse incision at the level of the antecubitum and dissected through the subcutaneous tissue and fascia to gain exposure of the brachial artery.   This was dissected out proximally and distally and controlled with vessel loops .  I then dissected out the cephalic vein.   The distal segment of the vein was not ligated but the median cubital branch was.    I then instilled the heparinized saline into the vein and clamped it.  At this point, I reset my exposure of the brachial artery  and placed the artery under tension proximally and distally.  I made an arteriotomy with a #11 blade, and then I extended the arteriotomy with a Potts scissor.  I injected heparinized saline proximal and distal to this arteriotomy.  The vein was then sewn to the artery in an end-to-side configuration with a running stitch of 6-0 Prolene.  Prior to completing this anastomosis, I allowed the vein and artery to backbleed.  There was no evidence of clot from any vessels.  I completed the anastomosis in the usual fashion and then released all vessel loops and clamps.  There was a palpable  thrill in the venous outflow, and there was a palpable radial pulse.  At this point, I irrigated out the surgical wound.  There was no further active bleeding.  The subcutaneous tissue was reapproximated with a running stitch of 3-0 Vicryl.  The skin was then reapproximated with a running subcuticular stitch of 4-0 Vicryl.  The skin was then cleaned, dried, and reinforced with skin glue.  The patient tolerated this procedure well.   COMPLICATIONS: none  CONDITION: good   Monserrath Junio C. Randie Heinzain, MD [facility] [place] Office: [phone] Pager: [phone]  05/28/2016, 10:47 AM

## 2016-05-28 NOTE — Progress Notes (Signed)
[facility] ROUNDING NOTE   Subjective:   Interval History: creatinine still increasing   Objective:  Vital signs in last 24 hours:  Temp:  [97.6 F (36.4 C)-98.6 F (37 C)] 97.6 F (36.4 C) (08/17 1045) Pulse Rate:  [76-85] 76 (08/17 1100) Resp:  [13-21] 15 (08/17 1100) BP: (119-146)/(62-83) 146/74 (08/17 1100) SpO2:  [93 %-99 %] 97 % (08/17 1100) Weight:  [116.8 kg (257 lb 8 oz)] 116.8 kg (257 lb 8 oz) (08/17 1950)  Weight change: -1 kg (-2 lb 3.3 oz) Filed Weights   05/25/16 2337 05/27/16 0646 05/28/16 9326  Weight: 119.8 kg (264 lb 1.8 oz) 117.8 kg (259 lb 11.2 oz) 116.8 kg (257 lb 8 oz)    Intake/Output: I/O last 3 completed shifts: In: 963 [P.O.:960; I.V.:3] Out: 2900 [Urine:2900]   Intake/Output this shift:  Total I/O In: 500 [I.V.:500] Out: 310 [Urine:300; Blood:10]  CVS- RRR RS- CTA ABD- BS present soft non-distended EXT- no edema   AVF left    Basic Metabolic Panel:  Recent Labs Lab 05/24/16 1600 05/25/16 0248 05/26/16 0350 05/27/16 0332 05/28/16 0351  NA 138 136 134* 135 138  K 5.1 4.4 4.2 4.2 4.4  CL 107 104 101 102 103  CO2 20* 22 22 21* 21*  GLUCOSE 92 105* 96 84 89  BUN 57* 54* 61* 69* 71*  CREATININE 9.30* 9.20* 10.04* 10.76* 11.56*  CALCIUM 8.3* 8.5* 8.3* 8.2* 8.3*    Liver Function Tests:  Recent Labs Lab 05/25/16 0248  AST 13*  ALT 12*  ALKPHOS 63  BILITOT 0.2*  PROT 5.2*  ALBUMIN 2.6*   No results for input(s): LIPASE, AMYLASE in the last 168 hours. No results for input(s): AMMONIA in the last 168 hours.  CBC:  Recent Labs Lab 05/24/16 2031 05/25/16 0248 05/26/16 0350 05/27/16 0332 05/28/16 0351  WBC 8.4 7.4 6.4 6.7 7.7  HGB 11.6* 10.6* 9.8* 9.8* 9.9*  HCT 33.3* 30.2* 28.4* 28.4* 28.6*  MCV 83.5 83.4 83.3 84.5 85.1  PLT 252 212 226 215 237    Cardiac Enzymes:  Recent Labs Lab 05/27/16 0807  CKTOTAL 109    BNP: Invalid input(s): POCBNP  CBG:  Recent Labs Lab 05/28/16 7124   PYKDXI 33    Microbiology: Results for orders placed or performed during the hospital encounter of 05/24/16  MRSA PCR Screening     Status: None   Collection Time: 05/24/16  8:14 PM  Result Value Ref Range Status   MRSA by PCR NEGATIVE NEGATIVE Final    Comment:        The GeneXpert MRSA Assay (FDA approved for NASAL specimens only), is one component of a comprehensive MRSA colonization surveillance program. It is not intended to diagnose MRSA infection nor to guide or monitor treatment for MRSA infections.   Surgical PCR screen     Status: None   Collection Time: 05/28/16  6:48 AM  Result Value Ref Range Status   MRSA, PCR NEGATIVE NEGATIVE Final   Staphylococcus aureus NEGATIVE NEGATIVE Final    Comment:        The Xpert SA Assay (FDA approved for NASAL specimens in patients over 43 years of age), is one component of a comprehensive surveillance program.  Test performance has been validated by [facility] for patients greater than or equal to 81 year old. It is not intended to diagnose infection nor to guide or monitor treatment.     Coagulation Studies:  Recent Labs  05/28/16 0351  LABPROT  14.6  INR 1.13    Urinalysis: No results for input(s): COLORURINE, LABSPEC, PHURINE, GLUCOSEU, HGBUR, BILIRUBINUR, KETONESUR, PROTEINUR, UROBILINOGEN, NITRITE, LEUKOCYTESUR in the last 72 hours.  Invalid input(s): APPERANCEUR    Imaging: No results found.   Medications:   . sodium chloride 10 mL/hr at 05/28/16 0820   . [MAR Hold] amLODipine  10 mg Oral Daily  . [MAR Hold] enoxaparin (LOVENOX) injection  30 mg Subcutaneous Q24H  . [MAR Hold] furosemide  40 mg Oral Daily  . [MAR Hold] hydrALAZINE  25 mg Oral Q8H  . [MAR Hold] labetalol  300 mg Oral TID  . [MAR Hold] pantoprazole  40 mg Oral Daily  . [MAR Hold] sodium chloride flush  3 mL Intravenous Q12H   acetaminophen **OR** acetaminophen (TYLENOL) oral liquid 160 mg/5 mL, fentaNYL (SUBLIMAZE) injection,  [MAR Hold] hydrALAZINE, [MAR Hold] HYDROcodone-acetaminophen, [MAR Hold] ondansetron **OR** [MAR Hold] ondansetron (ZOFRAN) IV, [MAR Hold] ondansetron, oxyCODONE **OR** oxyCODONE, [MAR Hold] sodium chloride  Assessment/ Plan:  1. Acute on CKD - Prior Creatinine 2.23, Prot/Cr ratio 4169 in January 2017. There is a creatinine of 3.9  6 weeks ago. Patient has uncontrolled HTN, proteinuria and nephritic-appearing sediment. 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 Also could have malignant HTN with TMA. This would need a renal biopsy to diagnose . SCr continues to rise. Not uremic, no urgent indication for HD right now. Anticipate he will ultimately need hemodialysis with progression of CKD.  A fistula was placed this morning   -serologies pending (ANA, SPEP, kappa/lamda light chains, Mpo/pr-3, anti-GBM) -oral Lasix 40 mg daily -Monitor renal function -SCr 9.30 >>9.20 >> 10.04 >> 10.79 >> 11.5  -No renallabwork on file per discussion with Dr. Rayford Halsted office - Fistula placed   2. Hypertensive urgency - on po labetalol, po norvasc, po hydralazine and polasix. BPs stable. 3. Hx back surgery 4. Vol excess - improving, adjustlasix as above    LOS: 4 Jamilee Lafosse W @TODAY @11 :18 AM

## 2016-05-28 NOTE — Progress Notes (Signed)
Patient asked Rn if he would be able to go outside for fresh air, RN told patient that is not normally allowed, but would page MD and ask. MD Melynda RippleHobbs returned page, no order obtained for patient to leave unit. Will inform patient. Will continue to monitor patient. Luther Parodyaitlin Lum KeasFoecking,RN

## 2016-05-28 NOTE — Plan of Care (Signed)
Problem: Pain Managment: Goal: General experience of comfort will improve Outcome: Progressing Pt remained free from pain in last 24 hour

## 2016-05-28 NOTE — Progress Notes (Signed)
  Progress Note    05/28/2016 9:05 AM Day of Surgery  Subjective: feeling good this a.m.  Vitals:   05/28/16 0644 05/28/16 0800  BP: 128/76 136/73  Pulse:    Resp:    Temp:  97.9 F (36.6 C)    Physical Exam: Cardiac:  rrr Lungs:  Non labored Abd: soft, ntnd Ext: palpable left radial pulse   CBC    Component Value Date/Time   WBC 7.7 05/28/2016 0351   RBC 3.36 (L) 05/28/2016 0351   HGB 9.9 (L) 05/28/2016 0351   HCT 28.6 (L) 05/28/2016 0351   PLT 237 05/28/2016 0351   MCV 85.1 05/28/2016 0351   MCH 29.5 05/28/2016 0351   MCHC 34.6 05/28/2016 0351   RDW 12.6 05/28/2016 0351   LYMPHSABS 2.3 01/19/2012 1150   MONOABS 0.5 01/19/2012 1150   EOSABS 0.3 01/19/2012 1150   BASOSABS 0.0 01/19/2012 1150    BMET    Component Value Date/Time   NA 138 05/28/2016 0351   K 4.4 05/28/2016 0351   CL 103 05/28/2016 0351   CO2 21 (L) 05/28/2016 0351   GLUCOSE 89 05/28/2016 0351   BUN 71 (H) 05/28/2016 0351   CREATININE 11.56 (H) 05/28/2016 0351   CALCIUM 8.3 (L) 05/28/2016 0351   GFRNONAA 5 (L) 05/28/2016 0351   GFRAA 5 (L) 05/28/2016 0351    INR    Component Value Date/Time   INR 1.13 05/28/2016 0351     Intake/Output Summary (Last 24 hours) at 05/28/16 0905 Last data filed at 05/28/16 0800  Gross per 24 hour  Intake              483 ml  Output             1750 ml  Net            -1267 ml     Assessment:  45 y.o. male with advanced ckd in need of hd access  Plan: OR today for left arm avf, will evaluate upper arm cephalic first Discussed risks and benefits specifically of injury to vessel or nerve, steal, failure of maturation all that may require future intervention. He along with his wife agree to proceed.   Maziah Keeling C. Randie Heinzain, MD [facility] [place] Office: [phone] Pager: [phone]  05/28/2016 9:05 AM

## 2016-05-28 NOTE — Anesthesia Postprocedure Evaluation (Signed)
Anesthesia Post Note  Patient: Ronald HolmesChristopher S Rounsaville  Procedure(s) Performed: Procedure(s) (LRB): LEFT ARM BRACHIOCEPHALIC ARTERIOVENOUS (AV) FISTULA CREATION (Left)  Patient location during evaluation: PACU Anesthesia Type: MAC Level of consciousness: awake Pain management: pain level controlled Vital Signs Assessment: post-procedure vital signs reviewed and stable Respiratory status: spontaneous breathing Cardiovascular status: stable Postop Assessment: no signs of nausea or vomiting Anesthetic complications: no    Last Vitals:  Vitals:   05/28/16 1045 05/28/16 1100  BP:  (!) 146/74  Pulse:  76  Resp:  15  Temp: 36.4 C     Last Pain:  Vitals:   05/28/16 0750  TempSrc:   PainSc: 0-No pain                 Jamaris Lidwina Kaner

## 2016-05-28 NOTE — Transfer of Care (Signed)
Immediate Anesthesia Transfer of Care Note  Patient: Salome HolmesChristopher S Stamey  Procedure(s) Performed: Procedure(s): LEFT ARM BRACHIOCEPHALIC ARTERIOVENOUS (AV) FISTULA CREATION (Left)  Patient Location: PACU  Anesthesia Type:MAC  Level of Consciousness: awake, alert  and oriented  Airway & Oxygen Therapy: Patient Spontanous Breathing and Patient connected to face mask oxygen  Post-op Assessment: Report given to RN, Post -op Vital signs reviewed and stable and Patient moving all extremities X 4  Post vital signs: Reviewed and stable  Last Vitals:  Vitals:   05/28/16 0644 05/28/16 0800  BP: 128/76 136/73  Pulse:    Resp:    Temp:  36.6 C    Last Pain:  Vitals:   05/28/16 0750  TempSrc:   PainSc: 0-No pain      Patients Stated Pain Goal: 0 (05/25/16 0132)  Complications: No apparent anesthesia complications

## 2016-05-28 NOTE — Anesthesia Preprocedure Evaluation (Addendum)
Anesthesia Evaluation  Patient identified by MRN, date of birth, ID band Patient awake    Reviewed: Allergy & Precautions, NPO status , Patient's Chart, lab work & pertinent test results  History of Anesthesia Complications Negative for: history of anesthetic complications  Airway Mallampati: I  TM Distance: >3 FB Neck ROM: Full    Dental  (+) Teeth Intact, Dental Advisory Given   Pulmonary neg shortness of breath, neg sleep apnea, neg recent URI,    breath sounds clear to auscultation       Cardiovascular hypertension, Pt. on medications (-) angina(-) Past MI and (-) CHF  Rhythm:Regular     Neuro/Psych  Headaches, negative psych ROS   GI/Hepatic negative GI ROS, Neg liver ROS,   Endo/Other    Renal/GU Renal InsufficiencyRenal disease     Musculoskeletal   Abdominal   Peds  Hematology  (+) anemia ,   Anesthesia Other Findings   Reproductive/Obstetrics                            Anesthesia Physical Anesthesia Plan  ASA: III  Anesthesia Plan: MAC   Post-op Pain Management:    Induction: Intravenous  Airway Management Planned: Natural Airway, Nasal Cannula and Simple Face Mask  Additional Equipment: None  Intra-op Plan:   Post-operative Plan:   Informed Consent:   Dental advisory given  Plan Discussed with: CRNA, Anesthesiologist and Surgeon  Anesthesia Plan Comments:        Anesthesia Quick Evaluation

## 2016-05-29 ENCOUNTER — Inpatient Hospital Stay (HOSPITAL_COMMUNITY): Payer: BLUE CROSS/BLUE SHIELD

## 2016-05-29 ENCOUNTER — Encounter (HOSPITAL_COMMUNITY): Payer: Self-pay | Admitting: Vascular Surgery

## 2016-05-29 ENCOUNTER — Inpatient Hospital Stay (HOSPITAL_COMMUNITY)
Admit: 2016-05-29 | Discharge: 2016-05-29 | Disposition: A | Discharge status: Home / Self Care | Payer: BLUE CROSS/BLUE SHIELD | Attending: Family Medicine | Admitting: Family Medicine

## 2016-05-29 LAB — RENAL FUNCTION PANEL
ALBUMIN: 2.6 g/dL — AB (ref 3.5–5.0)
Anion gap: 13 (ref 5–15)
BUN: 73 mg/dL — ABNORMAL HIGH (ref 6–20)
CALCIUM: 8.4 mg/dL — AB (ref 8.9–10.3)
CO2: 20 mmol/L — AB (ref 22–32)
CREATININE: 11.52 mg/dL — AB (ref 0.61–1.24)
Chloride: 104 mmol/L (ref 101–111)
GFR, EST AFRICAN AMERICAN: 5 mL/min — AB (ref >60)
GFR, EST NON AFRICAN AMERICAN: 5 mL/min — AB (ref >60)
Glucose, Bld: 90 mg/dL (ref 65–99)
PHOSPHORUS: 8.8 mg/dL — AB (ref 2.5–4.6)
Potassium: 4.1 mmol/L (ref 3.5–5.1)
SODIUM: 137 mmol/L (ref 135–145)

## 2016-05-29 LAB — GLUCOSE, CAPILLARY: Glucose-Capillary: 92 mg/dL (ref 65–99)

## 2016-05-29 MED ORDER — MIDAZOLAM HCL 2 MG/2ML IJ SOLN
INTRAMUSCULAR | Status: AC | PRN
  Administered 2016-05-29: 2 mg via INTRAVENOUS

## 2016-05-29 MED ORDER — MIDAZOLAM HCL 2 MG/2ML IJ SOLN
INTRAMUSCULAR | Status: AC
  Filled 2016-05-29: qty 2

## 2016-05-29 MED ORDER — FENTANYL CITRATE (PF) 100 MCG/2ML IJ SOLN
INTRAMUSCULAR | Status: AC
  Filled 2016-05-29: qty 2

## 2016-05-29 MED ORDER — LIDOCAINE HCL 1 % IJ SOLN
INTRAMUSCULAR | Status: AC
  Filled 2016-05-29: qty 20

## 2016-05-29 MED ORDER — ENOXAPARIN SODIUM 30 MG/0.3ML ~~LOC~~ SOLN
30.0000 mg | SUBCUTANEOUS | Status: DC
  Administered 2016-05-31: 30 mg via SUBCUTANEOUS
  Filled 2016-05-29 (×2): qty 0.3

## 2016-05-29 MED ORDER — FENTANYL CITRATE (PF) 100 MCG/2ML IJ SOLN
INTRAMUSCULAR | Status: AC | PRN
  Administered 2016-05-29: 50 ug via INTRAVENOUS

## 2016-05-29 MED ORDER — GELATIN ABSORBABLE 12-7 MM EX MISC
CUTANEOUS | Status: AC
  Filled 2016-05-29: qty 1

## 2016-05-29 NOTE — Progress Notes (Addendum)
PROGRESS NOTE    [name]  AVW:[id number] DOB: 1971-02-05 DOA: 05/24/2016 PCP: Nonnie Done., MD   Chief Complaint  Patient presents with  . Headache    with Hypertension    Brief Narrative:  HPI on 05/24/2016 by Dr. Sanda Klein [name] is a 45 y.o. male with medical history significant of hypertension, back surgery is coming to the emergency department due to severe headache since Wednesday.  Per patient, he has been having trouble with his blood pressure control. He has had a history of hypertension for about 5 years and was taken losartan/hydrochlorothiazide 50/12.5 mg by mouth daily until about 3 months ago, when he was doubled to 100/25 mg to obtain a better BP control. About 5 weeks ago, the patient was started on Norvasc 5 mg by mouth daily by his PCP. He states that he discontinued the losartan/HCTZ, since he apparently understood that he was supposed to change to amlodipine only for treatment, instead of adding it to the current therapy.   On Wednesday, the patient started having significant frontal and occipital headaches associated with nausea and photophobia, minimally relieved by aspirin 650 mg or ibuprofen 400 mg by mouth several times a day. He also restarted his Losartan/HCTZ 100/25 mg Wednesday. However, he states that the headaches and poor blood pressure control continued since then. He states, that this morning he took 2 tablets of Sudafed for his headache, thinking that he was a sinus headache, without any relief. At that point, he decided to come to the emergency department for evaluation. He denies chest pain, palpitations, dizziness, diaphoresis, dyspnea, PND or orthopnea, but complains of edema of feet and ankles and hands. He denies fever, chills, productive cough, abdominal pain, constipation, melena or hematochezia. He had an episode of diarrhea earlier today. He denies dysuria, oliguria, hematuria or urinary frequency.  Interim  history Nephrology consulted, creatinine worsening despite lasix, pt will need HD, 8/18 renal biopsy. Vascular consulted for access completed. Non acute echo.  Assessment & Plan   Acute on chronic kidney disease unknown stage -Likely secondary to multifactorial causes: NSAID use, HTN -Upon admission, creatinine 9.3, last Creatinine in the system 0.97 (2013), however per patient, last known creatinine around 2 -Pending records from PCP and nephrologist, Dr. Azzie Almas -Currently Creatinine 10.76 -Not placed on IVF due to lower ext edema and possible volume overload -Given lasix 80mg  IV q8h- transitioned to PO lasix 40mg  daily -Monitor intake/output and fluid restriction. UOP over past 24 hrs: 2330cc -Renal US: no hydronephrosis, echogenic renal parenchymal appearance B/L- medical renal disease -Nephrology consulted and appreciated -Complement CH50: >60,  C3/4 WNL, RF neg, Hep panel negative, HIV nonreactive, ANA neg, MPO neg -Kappa chains 104.3, Lambda 80.4 (both elevated) -Spoke with Dr. Hyman Hopes, will order [id number], renal biopsy -Vascular surgery consulted and access present  Accelerated hypertension/Unc HTN -Due to confusion about medication regimen -Upon admission, BP 191/119 -Continue labetalol, amlodipine, hydralazine, IV lasix -BP better  Lower extremity edema/volume overload -Likely secondary to kidney injury -Mildly elevated BNP upon admission 108 -Echocardiogram --> Normal EF, no wall motion abnormalities  Normocytic Anemia/Anemia of chronic disease -hemoglobin currently 9.8 -Continue to monitor CBC  Headache -Resolved, Possible secondary to the above -CT head: normal -Continue pain control  History of back surgery  DVT Prophylaxis  Lovenox  Code Status: Full  Family Communication: Wife at bedside  Disposition Plan: Admitted, pending further workup and vascular access  Consultants Nephrology  Vascular surgery Interventional Radiology  Procedures  Renal  US  Upper ext vein mapping  Antibiotics   Anti-infectives    Start     Dose/Rate Route Frequency Ordered Stop   05/28/16 0600  cefUROXime (ZINACEF) 1.5 g in dextrose 5 % 50 mL IVPB     1.5 g 100 mL/hr over 30 Minutes Intravenous On call to O.R. 05/27/16 0806 05/28/16 0948      Subjective:   [name] seen and examined today. No events per nursing. Denies CP. No SOB.   Objective:   Vitals:   05/29/16 1155 05/29/16 1600 05/29/16 1639 05/29/16 1754  BP: (!) 151/86 132/79 132/79 140/85  Pulse: 73 70 76 78  Resp: 19 16  20   Temp: 97.5 F (36.4 C)   98.2 F (36.8 C)  TempSrc: Oral   Oral  SpO2: 99% 96%  98%  Weight:      Height:        Intake/Output Summary (Last 24 hours) at 05/29/16 1919 Last data filed at 05/29/16 1600  Gross per 24 hour  Intake              480 ml  Output             2130 ml  Net            -1650 ml   Filed Weights   05/27/16 0646 05/28/16 0638 05/29/16 0501  Weight: 117.8 kg (259 lb 11.2 oz) 116.8 kg (257 lb 8 oz) 116.1 kg (255 lb 15.3 oz)    Exam  General: Well developed, well nourished, NAD  HEENT: NCAT, mucous membranes moist.   Cardiovascular: S1 S2 auscultated, RRR  Respiratory: Clear to auscultation bilaterally with equal chest rise  Abdomen: Soft, obese, nontender, nondistended, + bowel sounds  Extremities: warm dry without cyanosis clubbing. +1LE edema B/L  Neuro: AAOx3, nonfocal  Psych: Anxious but appropriate,pleasant   Data Reviewed: I have personally reviewed following labs and imaging studies  CBC:  Recent Labs Lab 05/24/16 2031 05/25/16 0248 05/26/16 0350 05/27/16 0332 05/28/16 0351  WBC 8.4 7.4 6.4 6.7 7.7  HGB 11.6* 10.6* 9.8* 9.8* 9.9*  HCT 33.3* 30.2* 28.4* 28.4* 28.6*  MCV 83.5 83.4 83.3 84.5 85.1  PLT 252 212 226 215 237   Basic Metabolic Panel:  Recent Labs Lab 05/25/16 0248 05/26/16 0350 05/27/16 0332 05/28/16 0351 05/29/16 0344  NA 136 134* 135 138 137  K 4.4 4.2 4.2 4.4 4.1  CL  104 101 102 103 104  CO2 22 22 21* 21* 20*  GLUCOSE 105* 96 84 89 90  BUN 54* 61* 69* 71* 73*  CREATININE 9.20* 10.04* 10.76* 11.56* 11.52*  CALCIUM 8.5* 8.3* 8.2* 8.3* 8.4*  MG  --   --   --  1.9  --   PHOS  --   --   --  8.1* 8.8*   GFR: Estimated Creatinine Clearance: 10.8 mL/min (by C-G formula based on SCr of 11.52 mg/dL). Liver Function Tests:  Recent Labs Lab 05/25/16 0248 05/29/16 0344  AST 13*  --   ALT 12*  --   ALKPHOS 63  --   BILITOT 0.2*  --   PROT 5.2*  --   ALBUMIN 2.6* 2.6*   No results for input(s): LIPASE, AMYLASE in the last 168 hours. No results for input(s): AMMONIA in the last 168 hours. Coagulation Profile:  Recent Labs Lab 05/28/16 0351  INR 1.13   Cardiac Enzymes:  Recent Labs Lab 05/27/16 0807  CKTOTAL 109   BNP (last 3 results) No results  for input(s): PROBNP in the last 8760 hours. HbA1C:  Recent Labs  05/27/16 0807  HGBA1C 4.7*   CBG:  Recent Labs Lab 05/28/16 0731 05/29/16 0738  GLUCAP 87 92   Lipid Profile: No results for input(s): CHOL, HDL, LDLCALC, TRIG, CHOLHDL, LDLDIRECT in the last 72 hours. Thyroid Function Tests: No results for input(s): TSH, T4TOTAL, FREET4, T3FREE, THYROIDAB in the last 72 hours. Anemia Panel: No results for input(s): VITAMINB12, FOLATE, FERRITIN, TIBC, IRON, RETICCTPCT in the last 72 hours. Urine analysis:    Component Value Date/Time   COLORURINE YELLOW 05/24/2016 1657   APPEARANCEUR HAZY (A) 05/24/2016 1657   LABSPEC 1.015 05/24/2016 1657   PHURINE 5.5 05/24/2016 1657   GLUCOSEU NEGATIVE 05/24/2016 1657   HGBUR LARGE (A) 05/24/2016 1657   BILIRUBINUR NEGATIVE 05/24/2016 1657   KETONESUR NEGATIVE 05/24/2016 1657   PROTEINUR >300 (A) 05/24/2016 1657   UROBILINOGEN 0.2 01/19/2012 1116   NITRITE NEGATIVE 05/24/2016 1657   LEUKOCYTESUR NEGATIVE 05/24/2016 1657   Sepsis Labs: @LABRCNTIP (procalcitonin:4,lacticidven:4)  ) Recent Results (from the past 240 hour(s))  MRSA PCR  Screening     Status: None   Collection Time: 05/24/16  8:14 PM  Result Value Ref Range Status   MRSA by PCR NEGATIVE NEGATIVE Final    Comment:        The GeneXpert MRSA Assay (FDA approved for NASAL specimens only), is one component of a comprehensive MRSA colonization surveillance program. It is not intended to diagnose MRSA infection nor to guide or monitor treatment for MRSA infections.   Surgical PCR screen     Status: None   Collection Time: 05/28/16  6:48 AM  Result Value Ref Range Status   MRSA, PCR NEGATIVE NEGATIVE Final   Staphylococcus aureus NEGATIVE NEGATIVE Final    Comment:        The Xpert SA Assay (FDA approved for NASAL specimens in patients over 87 years of age), is one component of a comprehensive surveillance program.  Test performance has been validated by [facility] for patients greater than or equal to 39 year old. It is not intended to diagnose infection nor to guide or monitor treatment.       Radiology Studies: Ct Guided Needle Placement  Result Date: 05/29/2016 INDICATION: 45 year old male with a history of renal failure. Has been referred for medical renal biopsy. EXAM: CT GUIDANCE NEEDLE PLACEMENT MEDICATIONS: None. ANESTHESIA/SEDATION: Moderate (conscious) sedation was employed during this procedure. A total of Versed 4.0 mg and Fentanyl 100 mcg was administered intravenously. Moderate Sedation Time: 15 minutes. The patient's level of consciousness and vital signs were monitored continuously by radiology nursing throughout the procedure under my direct supervision. FLUOROSCOPY TIME:  CT COMPLICATIONS: None PROCEDURE: Informed written consent was obtained from the patient after a thorough discussion of the procedural risks, benefits and alternatives. All questions were addressed. Maximal Sterile Barrier Technique was utilized including caps, mask, sterile gowns, sterile gloves, sterile drape, hand hygiene and skin antiseptic. A timeout was  performed prior to the initiation of the procedure. Patient position prone position on CT gantry table. Scout CT acquired for planning purposes. The patient is prepped and draped in usual sterile fashion. The skin and subcutaneous tissues were generously infiltrated 1% lidocaine for local anesthesia. Using CT guidance, a 15 gauge needle was advanced into the lateral cortex of the left kidney. 216 gauge core biopsy were achieved. Needle was removed and a final image was stored. Patient tolerated the procedure well and remained hemodynamically stable throughout. No complications  were encountered and no significant blood loss encountered. IMPRESSION: Status post CT-guided biopsy of left kidney for medical renal purpose. Tissue specimen sent to pathology for complete histopathologic analysis. Signed, Yvone Neu. Loreta Ave, DO [facility] Radiology Electronically Signed   By: Gilmer Mor D.O.   On: 05/29/2016 11:44   US Abdomen Limited  Result Date: 05/29/2016 CLINICAL DATA:  45 year old male with a history of renal failure. He has been referred for biopsy. EXAM: US ABDOMEN LIMITED - RIGHT UPPER QUADRANT COMPARISON:  05/24/2016 FINDINGS: The procedure, risks, benefits, and alternatives were explained to the patient and the patient's family. Questions regarding the procedure were encouraged and answered. The patient understands and consents to the procedure. Ultrasound survey was performed with images stored and sent to PACs. The patient was then prepped and draped in the usual sterile fashion. The skin and subcutaneous tissues were generously infiltrated 1% lidocaine for local anesthesia. Small stab incision was made with 11 blade scalpel. Using ultrasound guidance, placement of 15 gauge needle was attempted into the right kidney cortex. Given the atrophic and heterogeneous nature of the renal parenchyma, ultrasound confirmation of a safe needle placement into the inferior  cortex of the kidney was inadequate. Ultrasound-guided biopsy is deferred for CT guidance. Patient tolerated the procedure well and remained hemodynamically stable throughout. No complications were encountered and no significant blood loss was encounter IMPRESSION: Limited ultrasound study for attempted ultrasound-guided renal biopsy, with inadequate visualization of renal parenchyma for safe and accurate needle placement. Ultrasound imaging deferred for CT imaging for adequate yield. Signed, Yvone Neu. Loreta Ave DO [facility] Radiology PLAN: Patient will be transported to CT for completion of the biopsy. Electronically Signed   By: Gilmer Mor D.O.   On: 05/29/2016 10:11     Scheduled Meds: . amLODipine  10 mg Oral Daily  . [START ON 05/30/2016] enoxaparin (LOVENOX) injection  30 mg Subcutaneous Q24H  . fentaNYL      . furosemide  40 mg Oral Daily  . gelatin adsorbable      . hydrALAZINE  25 mg Oral Q8H  . labetalol  300 mg Oral TID  . lidocaine      . midazolam      . midazolam      . pantoprazole  40 mg Oral Daily  . sodium chloride flush  3 mL Intravenous Q12H   Continuous Infusions: . sodium chloride 10 mL/hr at 05/28/16 0820     LOS: 5 days   Time Spent in minutes   30 minutes  Haydee Salter MD on 05/29/2016 at 7:19 PM  Between 7am to 7pm - Pager - [phone]  After 7pm go to [email or web address] - password [email or web address]  And look for the night coverage person covering for me after hours  [facility] Office  [phone]

## 2016-05-29 NOTE — Procedures (Signed)
Interventional Radiology Procedure Note  Procedure: CT guided bx, left kidney, medical renal  Complications: None Recommendations:  - Ok to shower tomorrow - Do not submerge for 7 days - Routine wound care  - Follow up pathology  Signed,  Yvone NeuJaime S. Loreta AveWagner, DO

## 2016-05-29 NOTE — Progress Notes (Signed)
Patient for image-guided bx.   US visualization poor, with atrophic kidneys.    Will use CT guidance for safety/accuracy.    Plan to proceed with CT guidance.   Signed,  Yvone NeuJaime S. Loreta AveWagner, DO

## 2016-05-29 NOTE — Plan of Care (Signed)
Problem: Health Behavior/Discharge Planning: Goal: Ability to manage health-related needs will improve Outcome: Progressing Handout given for CKD diet

## 2016-05-29 NOTE — Sedation Documentation (Signed)
Patient denies pain and is resting comfortably.  

## 2016-05-29 NOTE — Sedation Documentation (Signed)
Patient is resting comfortably. 

## 2016-05-29 NOTE — Plan of Care (Signed)
Problem: Food- and Nutrition-Related Knowledge Deficit (NB-1.1) Goal: Nutrition education Formal process to instruct or train a patient/client in a skill or to impart knowledge to help patients/clients voluntarily manage or modify food choices and eating behavior to maintain or improve health. Outcome: Completed/Met Date Met: 05/29/16  RD consulted for renal diet education. Provided Choose-A-Meal Booklet to patient/family. Reviewed food groups and provided written recommended serving sizes specifically determined for patient's current nutritional status.   Explained why diet restrictions are needed and provided lists of foods to limit/avoid that are high potassium, sodium, and phosphorus. Provided specific recommendations on safer alternatives of these foods. Strongly encouraged compliance of this diet.   Discussed importance of protein intake at each meal and snack. Provided examples of how to maximize protein intake throughout the day. Discussed need for fluid restriction with dialysis, importance of minimizing weight gain between HD treatments, and renal-friendly beverage options.  Encouraged pt to discuss specific diet questions/concerns with RD at HD outpatient facility. Teach back method used.  Expect good compliance.  Body mass index is 33.77 kg/m. Pt meets criteria for Obesity Class I based on current BMI.  Current diet order is Renal with 1200 ml fluid restriction, patient is consuming approximately 100% of meals at this time. Labs and medications reviewed. No further nutrition interventions warranted at this time.  If additional nutrition issues arise, please re-consult RD.  Katie Lamberton, RD, LDN Pager #: [phone] After-Hours Pager #: [phone]     

## 2016-05-29 NOTE — Progress Notes (Signed)
  Progress Note    05/29/2016 6:42 AM 1 Day Post-Op  Subjective: pain improving left arm  Vitals:   05/29/16 0002 05/29/16 0501  BP: (!) 150/90 131/79  Pulse: 93 81  Resp: 13 18  Temp: 98.6 F (37 C) 98.2 F (36.8 C)    Physical Exam: Palpable thrill at antecubitum left Palpable left radial pulse  CBC    Component Value Date/Time   WBC 7.7 05/28/2016 0351   RBC 3.36 (L) 05/28/2016 0351   HGB 9.9 (L) 05/28/2016 0351   HCT 28.6 (L) 05/28/2016 0351   PLT 237 05/28/2016 0351   MCV 85.1 05/28/2016 0351   MCH 29.5 05/28/2016 0351   MCHC 34.6 05/28/2016 0351   RDW 12.6 05/28/2016 0351   LYMPHSABS 2.3 01/19/2012 1150   MONOABS 0.5 01/19/2012 1150   EOSABS 0.3 01/19/2012 1150   BASOSABS 0.0 01/19/2012 1150    BMET    Component Value Date/Time   NA 137 05/29/2016 0344   K 4.1 05/29/2016 0344   CL 104 05/29/2016 0344   CO2 20 (L) 05/29/2016 0344   GLUCOSE 90 05/29/2016 0344   BUN 73 (H) 05/29/2016 0344   CREATININE 11.52 (H) 05/29/2016 0344   CALCIUM 8.4 (L) 05/29/2016 0344   GFRNONAA 5 (L) 05/29/2016 0344   GFRAA 5 (L) 05/29/2016 0344    INR    Component Value Date/Time   INR 1.13 05/28/2016 0351     Intake/Output Summary (Last 24 hours) at 05/29/16 0642 Last data filed at 05/29/16 0620  Gross per 24 hour  Intake              740 ml  Output             2590 ml  Net            -1850 ml     Assessment:  45 y.o. male is s/p lue avf for initial hemodialysis.   Plan: -f/u in 4 weeks with duplex -available if needed for tunneled catheter   Keishawna Carranza C. Randie Heinzain, MD [facility] [place] Office: [phone] Pager: [phone]  05/29/2016 6:42 AM

## 2016-05-29 NOTE — Progress Notes (Signed)
[facility] ROUNDING NOTE   Subjective:   Interval History:  Renal biopsy today  Objective:  Vital signs in last 24 hours:  Temp:  [97.9 F (36.6 C)-98.8 F (37.1 C)] 98.3 F (36.8 C) (08/18 0738) Pulse Rate:  [65-93] 75 (08/18 1046) Resp:  [13-19] 14 (08/18 1046) BP: (124-156)/(71-98) 145/83 (08/18 1046) SpO2:  [95 %-100 %] 100 % (08/18 1046) Weight:  [116.1 kg (255 lb 15.3 oz)] 116.1 kg (255 lb 15.3 oz) (08/18 0501)  Weight change: -0.7 kg (-1 lb 8.7 oz) Filed Weights   05/27/16 0646 05/28/16 0638 05/29/16 0501  Weight: 117.8 kg (259 lb 11.2 oz) 116.8 kg (257 lb 8 oz) 116.1 kg (255 lb 15.3 oz)    Intake/Output: I/O last 3 completed shifts: In: 740 [P.O.:240; I.V.:500] Out: 3290 [Urine:3280; Blood:10]   Intake/Output this shift:  No intake/output data recorded.  CVS- RRR RS- CTA ABD- BS present soft non-distended EXT- no edema   Basic Metabolic Panel:  Recent Labs Lab 05/25/16 0248 05/26/16 0350 05/27/16 0332 05/28/16 0351 05/29/16 0344  NA 136 134* 135 138 137  K 4.4 4.2 4.2 4.4 4.1  CL 104 101 102 103 104  CO2 22 22 21* 21* 20*  GLUCOSE 105* 96 84 89 90  BUN 54* 61* 69* 71* 73*  CREATININE 9.20* 10.04* 10.76* 11.56* 11.52*  CALCIUM 8.5* 8.3* 8.2* 8.3* 8.4*  MG  --   --   --  1.9  --   PHOS  --   --   --  8.1* 8.8*    Liver Function Tests:  Recent Labs Lab 05/25/16 0248 05/29/16 0344  AST 13*  --   ALT 12*  --   ALKPHOS 63  --   BILITOT 0.2*  --   PROT 5.2*  --   ALBUMIN 2.6* 2.6*   No results for input(s): LIPASE, AMYLASE in the last 168 hours. No results for input(s): AMMONIA in the last 168 hours.  CBC:  Recent Labs Lab 05/24/16 2031 05/25/16 0248 05/26/16 0350 05/27/16 0332 05/28/16 0351  WBC 8.4 7.4 6.4 6.7 7.7  HGB 11.6* 10.6* 9.8* 9.8* 9.9*  HCT 33.3* 30.2* 28.4* 28.4* 28.6*  MCV 83.5 83.4 83.3 84.5 85.1  PLT 252 212 226 215 237    Cardiac Enzymes:  Recent Labs Lab 05/27/16 0807  CKTOTAL 109     BNP: Invalid input(s): POCBNP  CBG:  Recent Labs Lab 05/28/16 0731 05/29/16 0738  GLUCAP 87 92    Microbiology: Results for orders placed or performed during the hospital encounter of 05/24/16  MRSA PCR Screening     Status: None   Collection Time: 05/24/16  8:14 PM  Result Value Ref Range Status   MRSA by PCR NEGATIVE NEGATIVE Final    Comment:        The GeneXpert MRSA Assay (FDA approved for NASAL specimens only), is one component of a comprehensive MRSA colonization surveillance program. It is not intended to diagnose MRSA infection nor to guide or monitor treatment for MRSA infections.   Surgical PCR screen     Status: None   Collection Time: 05/28/16  6:48 AM  Result Value Ref Range Status   MRSA, PCR NEGATIVE NEGATIVE Final   Staphylococcus aureus NEGATIVE NEGATIVE Final    Comment:        The Xpert SA Assay (FDA approved for NASAL specimens in patients over 73 years of age), is one component of a comprehensive surveillance program.  Test performance has been validated  by [facility] for patients greater than or equal to 72 year old. It is not intended to diagnose infection nor to guide or monitor treatment.     Coagulation Studies:  Recent Labs  05/28/16 0351  LABPROT 14.6  INR 1.13    Urinalysis: No results for input(s): COLORURINE, LABSPEC, PHURINE, GLUCOSEU, HGBUR, BILIRUBINUR, KETONESUR, PROTEINUR, UROBILINOGEN, NITRITE, LEUKOCYTESUR in the last 72 hours.  Invalid input(s): APPERANCEUR    Imaging: US Abdomen Limited  Result Date: 05/29/2016 CLINICAL DATA:  45 year old male with a history of renal failure. He has been referred for biopsy. EXAM: US ABDOMEN LIMITED - RIGHT UPPER QUADRANT COMPARISON:  05/24/2016 FINDINGS: The procedure, risks, benefits, and alternatives were explained to the patient and the patient's family. Questions regarding the procedure were encouraged and answered. The patient understands and consents to the  procedure. Ultrasound survey was performed with images stored and sent to PACs. The patient was then prepped and draped in the usual sterile fashion. The skin and subcutaneous tissues were generously infiltrated 1% lidocaine for local anesthesia. Small stab incision was made with 11 blade scalpel. Using ultrasound guidance, placement of 15 gauge needle was attempted into the right kidney cortex. Given the atrophic and heterogeneous nature of the renal parenchyma, ultrasound confirmation of a safe needle placement into the inferior cortex of the kidney was inadequate. Ultrasound-guided biopsy is deferred for CT guidance. Patient tolerated the procedure well and remained hemodynamically stable throughout. No complications were encountered and no significant blood loss was encounter IMPRESSION: Limited ultrasound study for attempted ultrasound-guided renal biopsy, with inadequate visualization of renal parenchyma for safe and accurate needle placement. Ultrasound imaging deferred for CT imaging for adequate yield. Signed, Dulcy Fanny. Earleen Newport DO [facility] PLAN: Patient will be transported to CT for completion of the biopsy. Electronically Signed   By: Corrie Mckusick D.O.   On: 05/29/2016 10:11     Medications:   . sodium chloride 10 mL/hr at 05/28/16 0820   . amLODipine  10 mg Oral Daily  . enoxaparin (LOVENOX) injection  30 mg Subcutaneous Q24H  . fentaNYL      . furosemide  40 mg Oral Daily  . gelatin adsorbable      . hydrALAZINE  25 mg Oral Q8H  . labetalol  300 mg Oral TID  . lidocaine      . midazolam      . midazolam      . pantoprazole  40 mg Oral Daily  . sodium chloride flush  3 mL Intravenous Q12H   hydrALAZINE, HYDROcodone-acetaminophen, ondansetron **OR** ondansetron (ZOFRAN) IV, ondansetron, sodium chloride  Assessment/ Plan:  1. Acute on CKD - Prior Creatinine 2.23, Prot/Cr ratio 4169 in January 2017. There is a creatinine  of 3.9 6 weeks ago. Patient has uncontrolled HTN, proteinuria and nephritic-appearing sediment. 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 Also could have malignant HTN with TMA. This would need a renal biopsy to diagnose. SCr continues to rise. Not uremic, no urgent indication for HD right now. Anticipate he will ultimately need hemodialysis with progression of CKD.  A fistula was placed this morning   -serologies pending (ANA, SPEP, kappa/lamda light chains, Mpo/pr-3, anti-GBM) -oral Lasix 40 mg daily -Monitor renal function -SCr 9.30 >>9.20 >>10.04 >> 10.79 >> 11.5 >> 11.2 -No renallabwork on file per discussion with Dr. Rayford Halsted office - Fistula placed   2. Hypertensive urgency - on po labetalol, po  norvasc, po hydralazine and polasix. BPs stable. 3. Hx back surgery 4. Vol excess - improving, adjustlasix as above      LOS: 5 [name] W @TODAY @10 :52 AM

## 2016-05-30 DIAGNOSIS — R51 Headache: Secondary | ICD-10-CM

## 2016-05-30 DIAGNOSIS — N189 Chronic kidney disease, unspecified: Secondary | ICD-10-CM

## 2016-05-30 DIAGNOSIS — I161 Hypertensive emergency: Principal | ICD-10-CM

## 2016-05-30 DIAGNOSIS — N179 Acute kidney failure, unspecified: Secondary | ICD-10-CM

## 2016-05-30 DIAGNOSIS — D649 Anemia, unspecified: Secondary | ICD-10-CM

## 2016-05-30 LAB — CBC WITH DIFFERENTIAL/PLATELET
BASOS PCT: 0 %
Basophils Absolute: 0 10*3/uL (ref 0.0–0.1)
EOS ABS: 0.3 10*3/uL (ref 0.0–0.7)
EOS PCT: 4 %
HCT: 28 % — ABNORMAL LOW (ref 39.0–52.0)
Hemoglobin: 9.7 g/dL — ABNORMAL LOW (ref 13.0–17.0)
LYMPHS ABS: 1.4 10*3/uL (ref 0.7–4.0)
Lymphocytes Relative: 20 %
MCH: 29.7 pg (ref 26.0–34.0)
MCHC: 34.6 g/dL (ref 30.0–36.0)
MCV: 85.6 fL (ref 78.0–100.0)
MONOS PCT: 8 %
Monocytes Absolute: 0.6 10*3/uL (ref 0.1–1.0)
NEUTROS PCT: 68 %
Neutro Abs: 4.8 10*3/uL (ref 1.7–7.7)
PLATELETS: 263 10*3/uL (ref 150–400)
RBC: 3.27 MIL/uL — ABNORMAL LOW (ref 4.22–5.81)
RDW: 12.5 % (ref 11.5–15.5)
WBC: 7 10*3/uL (ref 4.0–10.5)

## 2016-05-30 LAB — BASIC METABOLIC PANEL
Anion gap: 14 (ref 5–15)
BUN: 79 mg/dL — AB (ref 6–20)
CALCIUM: 8.5 mg/dL — AB (ref 8.9–10.3)
CHLORIDE: 102 mmol/L (ref 101–111)
CO2: 20 mmol/L — AB (ref 22–32)
CREATININE: 11.39 mg/dL — AB (ref 0.61–1.24)
GFR calc non Af Amer: 5 mL/min — ABNORMAL LOW (ref >60)
GFR, EST AFRICAN AMERICAN: 5 mL/min — AB (ref >60)
Glucose, Bld: 91 mg/dL (ref 65–99)
Potassium: 4.2 mmol/L (ref 3.5–5.1)
SODIUM: 136 mmol/L (ref 135–145)

## 2016-05-30 LAB — PHOSPHORUS: PHOSPHORUS: 8.4 mg/dL — AB (ref 2.5–4.6)

## 2016-05-30 LAB — MAGNESIUM: MAGNESIUM: 2.1 mg/dL (ref 1.7–2.4)

## 2016-05-30 NOTE — Progress Notes (Signed)
[facility] ROUNDING NOTE   Subjective:   Interval History: feels great no complaints  Objective:  Vital signs in last 24 hours:  Temp:  [97.5 F (36.4 C)-98.5 F (36.9 C)] 98.5 F (36.9 C) (08/19 0400) Pulse Rate:  [70-81] 76 (08/19 0513) Resp:  [13-24] 20 (08/19 0513) BP: (124-156)/(66-87) 136/79 (08/19 0513) SpO2:  [95 %-100 %] 96 % (08/19 0513)  Weight change:  Filed Weights   05/27/16 0646 05/28/16 0638 05/29/16 0501  Weight: 117.8 kg (259 lb 11.2 oz) 116.8 kg (257 lb 8 oz) 116.1 kg (255 lb 15.3 oz)    Intake/Output: I/O last 3 completed shifts: In: 46 [P.O.:600; I.V.:10] Out: 3930 [Urine:3930]   Intake/Output this shift:  No intake/output data recorded.  CVS- RRR RS- CTA ABD- BS present soft non-distended EXT- no edema   Basic Metabolic Panel:  Recent Labs Lab 05/26/16 0350 05/27/16 0332 05/28/16 0351 05/29/16 0344 05/30/16 0338  NA 134* 135 138 137 136  K 4.2 4.2 4.4 4.1 4.2  CL 101 102 103 104 102  CO2 22 21* 21* 20* 20*  GLUCOSE 96 84 89 90 91  BUN 61* 69* 71* 73* 79*  CREATININE 10.04* 10.76* 11.56* 11.52* 11.39*  CALCIUM 8.3* 8.2* 8.3* 8.4* 8.5*  MG  --   --  1.9  --  2.1  PHOS  --   --  8.1* 8.8* 8.4*    Liver Function Tests:  Recent Labs Lab 05/25/16 0248 05/29/16 0344  AST 13*  --   ALT 12*  --   ALKPHOS 63  --   BILITOT 0.2*  --   PROT 5.2*  --   ALBUMIN 2.6* 2.6*   No results for input(s): LIPASE, AMYLASE in the last 168 hours. No results for input(s): AMMONIA in the last 168 hours.  CBC:  Recent Labs Lab 05/25/16 0248 05/26/16 0350 05/27/16 0332 05/28/16 0351 05/30/16 0338  WBC 7.4 6.4 6.7 7.7 7.0  NEUTROABS  --   --   --   --  4.8  HGB 10.6* 9.8* 9.8* 9.9* 9.7*  HCT 30.2* 28.4* 28.4* 28.6* 28.0*  MCV 83.4 83.3 84.5 85.1 85.6  PLT 212 226 215 237 263    Cardiac Enzymes:  Recent Labs Lab 05/27/16 0807  CKTOTAL 109    BNP: Invalid input(s): POCBNP  CBG:  Recent Labs Lab  05/28/16 0731 05/29/16 0738  GLUCAP 87 23    Microbiology: Results for orders placed or performed during the hospital encounter of 05/24/16  MRSA PCR Screening     Status: None   Collection Time: 05/24/16  8:14 PM  Result Value Ref Range Status   MRSA by PCR NEGATIVE NEGATIVE Final    Comment:        The GeneXpert MRSA Assay (FDA approved for NASAL specimens only), is one component of a comprehensive MRSA colonization surveillance program. It is not intended to diagnose MRSA infection nor to guide or monitor treatment for MRSA infections.   Surgical PCR screen     Status: None   Collection Time: 05/28/16  6:48 AM  Result Value Ref Range Status   MRSA, PCR NEGATIVE NEGATIVE Final   Staphylococcus aureus NEGATIVE NEGATIVE Final    Comment:        The Xpert SA Assay (FDA approved for NASAL specimens in patients over 9 years of age), is one component of a comprehensive surveillance program.  Test performance has been validated by [facility] for patients greater than or equal to 1  year old. It is not intended to diagnose infection nor to guide or monitor treatment.     Coagulation Studies:  Recent Labs  05/28/16 0351  LABPROT 14.6  INR 1.13    Urinalysis: No results for input(s): COLORURINE, LABSPEC, PHURINE, GLUCOSEU, HGBUR, BILIRUBINUR, KETONESUR, PROTEINUR, UROBILINOGEN, NITRITE, LEUKOCYTESUR in the last 72 hours.  Invalid input(s): APPERANCEUR    Imaging: Ct Guided Needle Placement  Result Date: 05/29/2016 INDICATION: 45 year old male with a history of renal failure. Has been referred for medical renal biopsy. EXAM: CT GUIDANCE NEEDLE PLACEMENT MEDICATIONS: None. ANESTHESIA/SEDATION: Moderate (conscious) sedation was employed during this procedure. A total of Versed 4.0 mg and Fentanyl 100 mcg was administered intravenously. Moderate Sedation Time: 15 minutes. The patient's level of consciousness and vital signs were monitored continuously by radiology  nursing throughout the procedure under my direct supervision. FLUOROSCOPY TIME:  CT COMPLICATIONS: None PROCEDURE: Informed written consent was obtained from the patient after a thorough discussion of the procedural risks, benefits and alternatives. All questions were addressed. Maximal Sterile Barrier Technique was utilized including caps, mask, sterile gowns, sterile gloves, sterile drape, hand hygiene and skin antiseptic. A timeout was performed prior to the initiation of the procedure. Patient position prone position on CT gantry table. Scout CT acquired for planning purposes. The patient is prepped and draped in usual sterile fashion. The skin and subcutaneous tissues were generously infiltrated 1% lidocaine for local anesthesia. Using CT guidance, a 15 gauge needle was advanced into the lateral cortex of the left kidney. 216 gauge core biopsy were achieved. Needle was removed and a final image was stored. Patient tolerated the procedure well and remained hemodynamically stable throughout. No complications were encountered and no significant blood loss encountered. IMPRESSION: Status post CT-guided biopsy of left kidney for medical renal purpose. Tissue specimen sent to pathology for complete histopathologic analysis. Signed, Dulcy Fanny. Earleen Newport, DO [facility] Electronically Signed   By: Corrie Mckusick D.O.   On: 05/29/2016 11:44   US Abdomen Limited  Result Date: 05/29/2016 CLINICAL DATA:  45 year old male with a history of renal failure. He has been referred for biopsy. EXAM: US ABDOMEN LIMITED - RIGHT UPPER QUADRANT COMPARISON:  05/24/2016 FINDINGS: The procedure, risks, benefits, and alternatives were explained to the patient and the patient's family. Questions regarding the procedure were encouraged and answered. The patient understands and consents to the procedure. Ultrasound survey was performed with images stored and sent to PACs. The patient  was then prepped and draped in the usual sterile fashion. The skin and subcutaneous tissues were generously infiltrated 1% lidocaine for local anesthesia. Small stab incision was made with 11 blade scalpel. Using ultrasound guidance, placement of 15 gauge needle was attempted into the right kidney cortex. Given the atrophic and heterogeneous nature of the renal parenchyma, ultrasound confirmation of a safe needle placement into the inferior cortex of the kidney was inadequate. Ultrasound-guided biopsy is deferred for CT guidance. Patient tolerated the procedure well and remained hemodynamically stable throughout. No complications were encountered and no significant blood loss was encounter IMPRESSION: Limited ultrasound study for attempted ultrasound-guided renal biopsy, with inadequate visualization of renal parenchyma for safe and accurate needle placement. Ultrasound imaging deferred for CT imaging for adequate yield. Signed, Dulcy Fanny. Earleen Newport DO [facility] Rad[facility] PLAN: Patient will be transported to CT for completion of the biopsy. Electronically Signed   By: Corrie Mckusick D.O.   On: 05/29/2016 10:11     Medications:   .  sodium chloride 10 mL/hr at 05/28/16 0820   . amLODipine  10 mg Oral Daily  . enoxaparin (LOVENOX) injection  30 mg Subcutaneous Q24H  . furosemide  40 mg Oral Daily  . hydrALAZINE  25 mg Oral Q8H  . labetalol  300 mg Oral TID  . pantoprazole  40 mg Oral Daily  . sodium chloride flush  3 mL Intravenous Q12H   hydrALAZINE, HYDROcodone-acetaminophen, ondansetron **OR** ondansetron (ZOFRAN) IV, ondansetron, sodium chloride  Assessment/ Plan:  1. Acute on CKD - Prior Creatinine 2.23, Prot/Cr ratio 4169 in January 2017. There is a creatinine of 3.9 6 weeks ago. Patient has uncontrolled HTN, proteinuria and nephritic-appearing sediment. 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 Also could  have malignant HTN with TMA. This would need a renal biopsy to diagnose. SCr continues to rise. Not uremic, no urgent indication for HD right now. Anticipate he will ultimately need hemodialysis with progression of CKD. A fistula was placed this morning  -serologies pending (ANA, SPEP, kappa/lamda light chains, Mpo/pr-3, anti-GBM) -oral Lasix 40 mg daily -Monitor renal function -SCr 9.30 >>9.20 >>10.04 >>10.79 >>11.5 >> 11.2 >> 11.39 -No renallabwork on file per discussion with Dr. Rayford Halsted office - Fistula placed   2. Hypertensive urgency - on po labetalol, po norvasc, po hydralazine and polasix. BPs stable. 3. Hx back surgery 4. Vol excess - improving, adjustlasix as above  If creatinine drops tomorrow I think home with outpatient follow up will be OK    LOS: 6 Jermale Crass W @TODAY @7 :21 AM

## 2016-05-30 NOTE — Progress Notes (Signed)
PROGRESS NOTE    [name]  WJX:[id number] DOB: 1971/06/03 DOA: 05/24/2016 PCP: Nonnie Done., MD   Brief Narrative: Di [name] a 45 y.o.malewith medical history significant of hypertension, back surgeryis coming to the emergency department due to severe headache since Wednesday.  Per patient, he has been having trouble with his blood pressure control. He has had a history of hypertension for about 5 years and was taken losartan/hydrochlorothiazide 50/12.5 mg by mouth daily until about 3 months ago, when he was doubled to 100/25 mg to obtain abetter BP control. About 5 weeks ago, the patient was started on Norvasc 5 mg by mouth daily by his PCP. He states that he discontinuedthe losartan/HCTZ, since he apparently understood that he was supposed to change to amlodipine only for treatment, instead of adding it to the current therapy.  On Wednesday prior to admission, the patient started having significant frontal and occipital headaches associated with nausea and photophobia, minimally relieved by aspirin 650 mg or ibuprofen 400 mg by mouth several times a day. He also restarted his Losartan/HCTZ 100/25 mg Wednesday. However, he states that the headaches and poor blood pressure control continued since then. He states, that this morning he took 2 tablets of Sudafed for his headache, thinking that he was a sinus headache, without any relief. At that point, he decided to come to the emergency department for evaluation. He denies chest pain, palpitations, dizziness, diaphoresis, dyspnea, PND or orthopnea, but complains of edema of feet and ankles and hands. He denies fever, chills, productive cough, abdominal pain, constipation, melena or hematochezia. He had an episode of diarrhea earlier today. He denies dysuria, oliguria, hematuria or urinary frequency.  Interim history Nephrology consulted, creatinine worsening despite lasix, pt will need HD, 8/18 renal biopsy.  Vascular consulted for access completed. Non acute echo.   Assessment & Plan:   Hypertensive emergency - BP improved to 120s-140s systolic - Now on amlodipine, lasix, hydralazine, labetalol - Avoid renally dosed medications  AKI on CKD, unknown stage - Multifactorial - consistent with uncontrolled HTN and likely NSAID overuse - No recent labs at PCP office per chart review - Cr maxed at 11.56, today is 11.39 (stable) - Had edema in the legs, on PO lasix and edema is improved today - Strict I/O, UOP 2300 last 24 hours - Renal US with medical renal disease - Nephrology is consulted - Note from Dr. Hyman Hopes today noted no acute need for HD - Fistula in place in left arm - Complement CH50: >60,  C3/4 WNL, RF neg, Hep panel negative, HIV nonreactive, ANA neg, MPO neg -Kappa chains 104.3, Lambda 80.4 (both elevated) - Renal Biopsy done 05/29/16 - results pending - ADAMTS13 level pending    Anemia - Hgb stable around 9.0 - Likely due to renal dysfunction, chronic illness - No active bleeding - If remains in house, consider ferritin/iron levels    Headache - Resolved today   DVT prophylaxis: Lovenox Code Status: Full Family Communication: Wife and patient at bedside Disposition Plan: Possible D/C tomorrow   Consultants:   Nephrology - Hyman Hopes  IR  Vascular Surgery   Procedures:   Kidney biopsy 8/18  Fistula placement 8/17  Antimicrobials:  None    Subjective: Doing well today, no complaints, swelling in left arm improved.   Objective: Vitals:   05/30/16 0300 05/30/16 0400 05/30/16 0500 05/30/16 0513  BP:  136/83  136/79  Pulse: 80 78 77 76  Resp: (!) 22 (!) 23 18 20   Temp:  98.5 F (36.9 C)    TempSrc:  Oral    SpO2: 98% 96% 96% 96%  Weight:      Height:        Intake/Output Summary (Last 24 hours) at 05/30/16 0706 Last data filed at 05/30/16 0515  Gross per 24 hour  Intake              490 ml  Output             2000 ml  Net            -1510 ml    Filed Weights   05/27/16 0646 05/28/16 0638 05/29/16 0501  Weight: 259 lb 11.2 oz (117.8 kg) 257 lb 8 oz (116.8 kg) 255 lb 15.3 oz (116.1 kg)    Examination:  General exam: Appears calm and comfortable  Respiratory system: Clear to auscultation. Respiratory effort normal. Cardiovascular system: S1 & S2 heard, RR, NR. No murmurs Gastrointestinal system: Abdomen is nondistended, soft and nontender.  Back: biopsy site clean and dry Central nervous system: Alert and oriented. No focal neurological deficits. Extremities: Symmetric 5 x 5 power.  Fistula site clean dry and swelling improved compared to tomorrow.  Pulse palpable in left radial artery.  Skin: No rashes, lesions or ulcers Psychiatry: Judgement and insight appear normal. Mood & affect appropriate.     Data Reviewed:   CBC:  Recent Labs Lab 05/25/16 0248 05/26/16 0350 05/27/16 0332 05/28/16 0351 05/30/16 0338  WBC 7.4 6.4 6.7 7.7 7.0  NEUTROABS  --   --   --   --  4.8  HGB 10.6* 9.8* 9.8* 9.9* 9.7*  HCT 30.2* 28.4* 28.4* 28.6* 28.0*  MCV 83.4 83.3 84.5 85.1 85.6  PLT 212 226 215 237 263   Basic Metabolic Panel:  Recent Labs Lab 05/26/16 0350 05/27/16 0332 05/28/16 0351 05/29/16 0344 05/30/16 0338  NA 134* 135 138 137 136  K 4.2 4.2 4.4 4.1 4.2  CL 101 102 103 104 102  CO2 22 21* 21* 20* 20*  GLUCOSE 96 84 89 90 91  BUN 61* 69* 71* 73* 79*  CREATININE 10.04* 10.76* 11.56* 11.52* 11.39*  CALCIUM 8.3* 8.2* 8.3* 8.4* 8.5*  MG  --   --  1.9  --  2.1  PHOS  --   --  8.1* 8.8* 8.4*   GFR: Estimated Creatinine Clearance: 10.9 mL/min (by C-G formula based on SCr of 11.39 mg/dL). Liver Function Tests:  Recent Labs Lab 05/25/16 0248 05/29/16 0344  AST 13*  --   ALT 12*  --   ALKPHOS 63  --   BILITOT 0.2*  --   PROT 5.2*  --   ALBUMIN 2.6* 2.6*   No results for input(s): LIPASE, AMYLASE in the last 168 hours. No results for input(s): AMMONIA in the last 168 hours. Coagulation Profile:  Recent  Labs Lab 05/28/16 0351  INR 1.13   Cardiac Enzymes:  Recent Labs Lab 05/27/16 0807  CKTOTAL 109   BNP (last 3 results) No results for input(s): PROBNP in the last 8760 hours. HbA1C:  Recent Labs  05/27/16 0807  HGBA1C 4.7*   CBG:  Recent Labs Lab 05/28/16 0731 05/29/16 0738  GLUCAP 87 92   Lipid Profile: No results for input(s): CHOL, HDL, LDLCALC, TRIG, CHOLHDL, LDLDIRECT in the last 72 hours. Thyroid Function Tests: No results for input(s): TSH, T4TOTAL, FREET4, T3FREE, THYROIDAB in the last 72 hours. Anemia Panel: No results for input(s): VITAMINB12, FOLATE, FERRITIN, TIBC, IRON, RETICCTPCT  in the last 72 hours. Sepsis Labs: No results for input(s): PROCALCITON, LATICACIDVEN in the last 168 hours.  Recent Results (from the past 240 hour(s))  MRSA PCR Screening     Status: None   Collection Time: 05/24/16  8:14 PM  Result Value Ref Range Status   MRSA by PCR NEGATIVE NEGATIVE Final    Comment:        The GeneXpert MRSA Assay (FDA approved for NASAL specimens only), is one component of a comprehensive MRSA colonization surveillance program. It is not intended to diagnose MRSA infection nor to guide or monitor treatment for MRSA infections.   Surgical PCR screen     Status: None   Collection Time: 05/28/16  6:48 AM  Result Value Ref Range Status   MRSA, PCR NEGATIVE NEGATIVE Final   Staphylococcus aureus NEGATIVE NEGATIVE Final    Comment:        The Xpert SA Assay (FDA approved for NASAL specimens in patients over 70 years of age), is one component of a comprehensive surveillance program.  Test performance has been validated by [facility] for patients greater than or equal to 46 year old. It is not intended to diagnose infection nor to guide or monitor treatment.          Radiology Studies: Ct Guided Needle Placement  Result Date: 05/29/2016 INDICATION: 45 year old male with a history of renal failure. Has been referred for medical  renal biopsy. EXAM: CT GUIDANCE NEEDLE PLACEMENT MEDICATIONS: None. ANESTHESIA/SEDATION: Moderate (conscious) sedation was employed during this procedure. A total of Versed 4.0 mg and Fentanyl 100 mcg was administered intravenously. Moderate Sedation Time: 15 minutes. The patient's level of consciousness and vital signs were monitored continuously by radiology nursing throughout the procedure under my direct supervision. FLUOROSCOPY TIME:  CT COMPLICATIONS: None PROCEDURE: Informed written consent was obtained from the patient after a thorough discussion of the procedural risks, benefits and alternatives. All questions were addressed. Maximal Sterile Barrier Technique was utilized including caps, mask, sterile gowns, sterile gloves, sterile drape, hand hygiene and skin antiseptic. A timeout was performed prior to the initiation of the procedure. Patient position prone position on CT gantry table. Scout CT acquired for planning purposes. The patient is prepped and draped in usual sterile fashion. The skin and subcutaneous tissues were generously infiltrated 1% lidocaine for local anesthesia. Using CT guidance, a 15 gauge needle was advanced into the lateral cortex of the left kidney. 216 gauge core biopsy were achieved. Needle was removed and a final image was stored. Patient tolerated the procedure well and remained hemodynamically stable throughout. No complications were encountered and no significant blood loss encountered. IMPRESSION: Status post CT-guided biopsy of left kidney for medical renal purpose. Tissue specimen sent to pathology for complete histopathologic analysis. Signed, Yvone Neu. Loreta Ave, DO [facility] Radiology Electronically Signed   By: Gilmer Mor D.O.   On: 05/29/2016 11:44   US Abdomen Limited  Result Date: 05/29/2016 CLINICAL DATA:  45 year old male with a history of renal failure. He has been referred for biopsy. EXAM: US ABDOMEN LIMITED  - RIGHT UPPER QUADRANT COMPARISON:  05/24/2016 FINDINGS: The procedure, risks, benefits, and alternatives were explained to the patient and the patient's family. Questions regarding the procedure were encouraged and answered. The patient understands and consents to the procedure. Ultrasound survey was performed with images stored and sent to PACs. The patient was then prepped and draped in the usual sterile fashion. The skin and subcutaneous tissues were generously  infiltrated 1% lidocaine for local anesthesia. Small stab incision was made with 11 blade scalpel. Using ultrasound guidance, placement of 15 gauge needle was attempted into the right kidney cortex. Given the atrophic and heterogeneous nature of the renal parenchyma, ultrasound confirmation of a safe needle placement into the inferior cortex of the kidney was inadequate. Ultrasound-guided biopsy is deferred for CT guidance. Patient tolerated the procedure well and remained hemodynamically stable throughout. No complications were encountered and no significant blood loss was encounter IMPRESSION: Limited ultrasound study for attempted ultrasound-guided renal biopsy, with inadequate visualization of renal parenchyma for safe and accurate needle placement. Ultrasound imaging deferred for CT imaging for adequate yield. Signed, Yvone Neu. Loreta Ave DO [facility] Radiology PLAN: Patient will be transported to CT for completion of the biopsy. Electronically Signed   By: Gilmer Mor D.O.   On: 05/29/2016 10:11        Scheduled Meds: . amLODipine  10 mg Oral Daily  . enoxaparin (LOVENOX) injection  30 mg Subcutaneous Q24H  . furosemide  40 mg Oral Daily  . hydrALAZINE  25 mg Oral Q8H  . labetalol  300 mg Oral TID  . pantoprazole  40 mg Oral Daily  . sodium chloride flush  3 mL Intravenous Q12H   Continuous Infusions: . sodium chloride 10 mL/hr at 05/28/16 0820     LOS: 6 days    Time spent: 25  minutes    Debe Coder, MD [facility] Pager [phone]  If 7PM-7AM, please contact night-coverage [email or web address] Password [email or web address] 05/30/2016, 7:06 AM

## 2016-05-30 NOTE — Plan of Care (Signed)
Problem: Activity: Goal: Risk for activity intolerance will decrease Outcome: Progressing Discussed plan of care for the evening and activities with wife present with teach back.

## 2016-05-30 NOTE — Progress Notes (Signed)
Patient transferred vis w/c with nurse tech on telemetry to %W.  Alert and oriented to person, place, time and situation.

## 2016-05-31 LAB — CBC WITH DIFFERENTIAL/PLATELET
BASOS ABS: 0 10*3/uL (ref 0.0–0.1)
BASOS PCT: 0 %
Eosinophils Absolute: 0.3 10*3/uL (ref 0.0–0.7)
Eosinophils Relative: 5 %
HEMATOCRIT: 26.4 % — AB (ref 39.0–52.0)
Hemoglobin: 9 g/dL — ABNORMAL LOW (ref 13.0–17.0)
Lymphocytes Relative: 24 %
Lymphs Abs: 1.6 10*3/uL (ref 0.7–4.0)
MCH: 28.8 pg (ref 26.0–34.0)
MCHC: 34.1 g/dL (ref 30.0–36.0)
MCV: 84.6 fL (ref 78.0–100.0)
MONO ABS: 0.7 10*3/uL (ref 0.1–1.0)
Monocytes Relative: 10 %
NEUTROS ABS: 4.1 10*3/uL (ref 1.7–7.7)
NEUTROS PCT: 61 %
Platelets: 258 10*3/uL (ref 150–400)
RBC: 3.12 MIL/uL — ABNORMAL LOW (ref 4.22–5.81)
RDW: 12.6 % (ref 11.5–15.5)
WBC: 6.7 10*3/uL (ref 4.0–10.5)

## 2016-05-31 LAB — BASIC METABOLIC PANEL
ANION GAP: 16 — AB (ref 5–15)
BUN: 86 mg/dL — ABNORMAL HIGH (ref 6–20)
CALCIUM: 8.5 mg/dL — AB (ref 8.9–10.3)
CO2: 20 mmol/L — ABNORMAL LOW (ref 22–32)
Chloride: 101 mmol/L (ref 101–111)
Creatinine, Ser: 12.09 mg/dL — ABNORMAL HIGH (ref 0.61–1.24)
GFR, EST AFRICAN AMERICAN: 5 mL/min — AB (ref >60)
GFR, EST NON AFRICAN AMERICAN: 4 mL/min — AB (ref >60)
GLUCOSE: 92 mg/dL (ref 65–99)
Potassium: 3.6 mmol/L (ref 3.5–5.1)
Sodium: 137 mmol/L (ref 135–145)

## 2016-05-31 MED ORDER — FUROSEMIDE 40 MG PO TABS: 40.0000 mg | ORAL_TABLET | Freq: Every day | ORAL | 3 refills | Status: AC

## 2016-05-31 MED ORDER — HYDRALAZINE HCL 25 MG PO TABS: 25.0000 mg | ORAL_TABLET | Freq: Three times a day (TID) | ORAL | 3 refills | Status: AC

## 2016-05-31 MED ORDER — LABETALOL HCL 300 MG PO TABS: 300.0000 mg | ORAL_TABLET | Freq: Three times a day (TID) | ORAL | 3 refills | Status: AC

## 2016-05-31 MED ORDER — AMLODIPINE BESYLATE 10 MG PO TABS: 10.0000 mg | ORAL_TABLET | Freq: Every day | ORAL | 3 refills | Status: AC

## 2016-05-31 NOTE — Progress Notes (Signed)
PROGRESS NOTE    [name]  MVH:[id number] DOB: [birth year] DOA: 05/24/2016 PCP: Nonnie Done., MD   Brief Narrative: [name] a 45 y.o.malewith medical history significant of hypertension, back surgeryis coming to the emergency department due to severe headache since Wednesday.  Per patient, he has been having trouble with his blood pressure control. He has had a history of hypertension for about 5 years and was taken losartan/hydrochlorothiazide 50/12.5 mg by mouth daily until about 3 months ago, when he was doubled to 100/25 mg to obtain abetter BP control. About 5 weeks ago, the patient was started on Norvasc 5 mg by mouth daily by his PCP. He states that he discontinuedthe losartan/HCTZ, since he apparently understood that he was supposed to change to amlodipine only for treatment, instead of adding it to the current therapy.  On Wednesday prior to admission, the patient started having significant frontal and occipital headaches associated with nausea and photophobia, minimally relieved by aspirin 650 mg or ibuprofen 400 mg by mouth several times a day. He also restarted his Losartan/HCTZ 100/25 mg Wednesday. However, he states that the headaches and poor blood pressure control continued since then. He states, that this morning he took 2 tablets of Sudafed for his headache, thinking that he was a sinus headache, without any relief. At that point, he decided to come to the emergency department for evaluation. He denies chest pain, palpitations, dizziness, diaphoresis, dyspnea, PND or orthopnea, but complains of edema of feet and ankles and hands. He denies fever, chills, productive cough, abdominal pain, constipation, melena or hematochezia. He had an episode of diarrhea earlier today. He denies dysuria, oliguria, hematuria or urinary frequency.  Interim history Nephrology consulted, creatinine worsening despite lasix, pt will possibly need HD, 8/18 renal  biopsy. Vascular consulted for access completed. Non acute echo.   Assessment & Plan:   Hypertensive emergency - BP improved to 120s-140s systolic - Now on amlodipine, lasix, hydralazine, labetalol - Avoid renally dosed medications  AKI on CKD, unknown stage - Multifactorial - consistent with uncontrolled HTN and likely NSAID overuse - No recent labs at PCP office per chart review - Cr maxed at 12.09, but UOP remains good (not well recorded over last 24 hours) - Renal US with medical renal disease - Nephrology is consulted - Note from Dr. 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 Hyman Hopes this week.    Consultants:   Nephrology - Hyman Hopes  IR  Vascular Surgery   Procedures:   Kidney biopsy 8/18  Fistula placement 8/17  Antimicrobials:  None    Subjective: Doing well today, no complaints  Objective: Vitals:   05/30/16 1700 05/30/16 2010 05/30/16 2103 05/31/16 0702  BP: 134/74 129/78 132/73 (!) 143/67  Pulse: 75 73 72 78  Resp:  (!) 24 19 19   Temp:  98.1 F (36.7 C) 98.9 F (37.2 C) 98.3 F (36.8 C)  TempSrc:  Oral    SpO2:  97% 100% 95%  Weight:   255 lb 1.6 oz (115.7 kg)   Height:   6\' 1"  (1.854 m)     Intake/Output Summary (Last 24 hours) at 05/31/16 1516 Last data filed at 05/31/16 0800  Gross per 24 hour  Intake              440 ml  Output              500 ml  Net              -60 ml   Filed Weights   05/28/16 9528 05/29/16 0501  05/30/16 2103  Weight: 257 lb 8 oz (116.8 kg) 255 lb 15.3 oz (116.1 kg) 255 lb 1.6 oz (115.7 kg)    Examination:  General exam: Appears calm and comfortable  Respiratory system: Respiratory effort normal, no audible wheezing Cardiovascular system: S1 & S2 heard  Central nervous system: Alert and oriented. No gross neurological deficits. Extremities: Fistula site clean dry and swelling improved compared to yesterday.   Skin: No rashes, lesions or ulcers noted in UE Psychiatry: Judgement and insight appear normal. Mood & affect appropriate.     Data Reviewed:   CBC:  Recent Labs Lab 05/26/16 0350 05/27/16 0332 05/28/16 0351 05/30/16 0338 05/31/16 0452  WBC 6.4 6.7 7.7 7.0 6.7  NEUTROABS  --   --   --  4.8 4.1  HGB 9.8* 9.8* 9.9* 9.7* 9.0*  HCT 28.4* 28.4* 28.6* 28.0* 26.4*  MCV 83.3 84.5 85.1 85.6 84.6  PLT 226 215 237 263 258   Basic Metabolic Panel:  Recent Labs Lab 05/27/16 0332 05/28/16 0351 05/29/16 0344 05/30/16 0338 05/31/16 0452  NA 135 138 137 136 137  K 4.2 4.4 4.1 4.2 3.6  CL 102 103 104 102 101  CO2 21* 21* 20* 20* 20*  GLUCOSE 84 89 90 91 92  BUN 69* 71* 73* 79* 86*  CREATININE 10.76* 11.56* 11.52* 11.39* 12.09*  CALCIUM 8.2* 8.3* 8.4* 8.5* 8.5*  MG  --  1.9  --  2.1  --   PHOS  --  8.1* 8.8* 8.4*  --    GFR: Estimated Creatinine Clearance: 10.3 mL/min (by C-G formula based on SCr of 12.09 mg/dL). Liver Function Tests:  Recent Labs Lab 05/25/16 0248 05/29/16 0344  AST 13*  --   ALT 12*  --   ALKPHOS 63  --   BILITOT 0.2*  --   PROT 5.2*  --   ALBUMIN 2.6* 2.6*   No results for input(s): LIPASE, AMYLASE in the last 168 hours. No results for input(s): AMMONIA in the last 168 hours. Coagulation Profile:  Recent Labs Lab 05/28/16 0351  INR 1.13   Cardiac Enzymes:  Recent Labs Lab 05/27/16 0807  CKTOTAL 109   BNP (last 3 results) No results for input(s): PROBNP in the last 8760 hours. HbA1C: No results for input(s): HGBA1C  in the last 72 hours. CBG:  Recent Labs Lab 05/28/16 0731 05/29/16 0738  GLUCAP 87 92   Lipid Profile: No results for input(s): CHOL, HDL, LDLCALC, TRIG, CHOLHDL, LDLDIRECT in the last 72 hours. Thyroid Function Tests: No results for input(s): TSH, T4TOTAL, FREET4, T3FREE, THYROIDAB in the last 72 hours. Anemia Panel: No results for input(s): VITAMINB12, FOLATE, FERRITIN, TIBC, IRON, RETICCTPCT in the last 72 hours. Sepsis Labs: No results for input(s): PROCALCITON, LATICACIDVEN in the last 168 hours.  Recent Results (from the past 240 hour(s))  MRSA PCR Screening  Status: None   Collection Time: 05/24/16  8:14 PM  Result Value Ref Range Status   MRSA by PCR NEGATIVE NEGATIVE Final    Comment:        The GeneXpert MRSA Assay (FDA approved for NASAL specimens only), is one component of a comprehensive MRSA colonization surveillance program. It is not intended to diagnose MRSA infection nor to guide or monitor treatment for MRSA infections.   Surgical PCR screen     Status: None   Collection Time: 05/28/16  6:48 AM  Result Value Ref Range Status   MRSA, PCR NEGATIVE NEGATIVE Final   Staphylococcus aureus NEGATIVE NEGATIVE Final    Comment:        The Xpert SA Assay (FDA approved for NASAL specimens in patients over 57 years of age), is one component of a comprehensive surveillance program.  Test performance has been validated by [facility] for patients greater than or equal to 57 year old. It is not intended to diagnose infection nor to guide or monitor treatment.          Radiology Studies: No results found.      Scheduled Meds:  Continuous Infusions:    LOS: 7 days    Time spent: 25 minutes    Debe Coder, MD [facility] Pager [phone]  If 7PM-7AM, please contact night-coverage [email or web address] Password [email or web address] 05/31/2016, 3:16 PM

## 2016-06-02 NOTE — Discharge Summary (Signed)
Physician Discharge Summary  [name] WJX:[id number] DOB: [birth year] DOA: 05/24/2016  PCP: Nonnie Done., MD  Admit date: 05/24/2016 Discharge date: 05/31/2016  Admitted From:  Home Disposition:   Home  Recommendations for Outpatient Follow-up:  1. Follow up with Nephrology in 1-2 days 2. Follow up with PCP in 2-4 weeks 3. Please obtain BMP in 1-2 days 4. Please follow up on the following pending results:  Renal biopsy  Home Health: No  Equipment/Devices: No   Discharge Condition: Stable CODE STATUS: Full Diet recommendation: Heart Healthy   Brief/Interim Summary: [name] is a 45 yo man who presented with issues with blood pressure control, headaches for which he was taking NSAIDs and acute renal failure.    Discharge Diagnoses:  Hypertensive emergency [name] was admitted with blood pressures in the 160s to 170s systolic.  He noted that he has had HTN for about 5 years and was on losartan-HCTZ prior to admission.  However, about 5 weeks ago, he had amlodipine added to his regimen.  Unfortunately, he misunderstood the directions and stopped his losartan-hctz.  He subsequently developed headaches and came in to the Ed.  IN the ED he had systolic blood pressures of 170s-190s.  CT head and EKG were unremarkable.  He was started on a cardene drip and admitted to the step down unit.  He was transitioned to oral medications and eventually was on amlodipine 10mg  daily, hydralazine 25mg  TID, labetalol 300mg  TID.  He was discharged on these medications with close follow up with nephrology.  A TTE was done and showed normal systolic and diastolic function.   AKI (acute kidney injury)  On admission, he was noted to have a Cr of 9.3, up from 0.97 in April of 2013.  This was attributed to uncontrolled HTN and NSAID use.  Nephrology was consulted.  UA showed WBC cast, large hemoglobin, > 300 protein.  ANA was negative, RF was negative, complement was high, SPEP showed no M spike,  light chains were high, but ratio was normal, C3/C4 was normal.  Antibodies for anti-GBM, streptolysin were not present and HIV and hepatitis were negative.  ANCA was normal. He was initiated on lasix and continued with good urine output throughout his stay. He had a renal ultrasound which showed no hydronephrosis and medical renal disease.  Vascular surgery was consulted on 8/15 for access placement. He underwent vein mapping on that same day. He underwent fistula placement in the left arm on 8/17 and tolerated the procedure well.  He  Underwent renal biopsy on 8/18 and tolerated the procedure well.  He also had ADAMTS13 activity checked and it was normal.  He was discharged on oral lasix.  He was asked to follow up with nephrology imminently on discharge.  He may need HD in the near future.  His Cr at discharge was 12.09.    Normocytic Anemia Thought to be chronic in nature and related to acute renal issues.  He had a slow decrease of his Hgb from 11.4 to 9.0 on discharge.  No signs/symptoms of bleeding.      Headaches Improved, thought to be related to his hypertensive emergency.  Treated with norco.      Discharge Instructions  Discharge Instructions    Call MD for:  difficulty breathing, headache or visual disturbances    Complete by:  As directed   Call MD for:  extreme fatigue    Complete by:  As directed   Call MD for:  persistant dizziness or  light-headedness    Complete by:  As directed   Call MD for:  persistant nausea and vomiting    Complete by:  As directed   Call MD for:  redness, tenderness, or signs of infection (pain, swelling, redness, odor or green/yellow discharge around incision site)    Complete by:  As directed   Call MD for:  severe uncontrolled pain    Complete by:  As directed   Call MD for:  temperature >100.4    Complete by:  As directed   Diet - low sodium heart healthy    Complete by:  As directed   Discharge instructions    Complete by:  As directed   Mr.  Stone - -   You were admitted for renal failure thought to be related to your blood pressure and NSAID use (ibuprofen, and others).  Your renal failure is stable.  You had a procedure to have a fistula placed in case you need dialysis, but you are making good urine and you can follow up with Nephrology outpatient.  Please call [facility] next week to make an appointment.   You will go home on new medications for your blood pressure Labetalol 3 times per day Hydralazine 3 times per day Lasix once daily Amlodipine at an increased dose of 10mg .    Please take all of these as prescribed.   Follow with Primary MD [name],[name], MD in 2-4 weeks   Get CBC, CMP checked  by Primary MD or nephrology in 5-7 days   Activity: As tolerated    Disposition Home    Diet:   Heart healthy   On your next visit with your primary care physician please Get Medicines reviewed and adjusted.   If you experience worsening of your admission symptoms, develop shortness of breath, life threatening emergency, suicidal or homicidal thoughts you must seek medical attention immediately by calling 911 or calling your MD immediately  if symptoms less severe.  You Must read complete instructions/literature along with all the possible adverse reactions/side effects for all the Medicines you take and that have been prescribed to you. Take any new Medicines after you have completely understood and accept all the possible adverse reactions/side effects.    Wear Seat belts while driving.   Please note  You were cared for by a hospitalist during your hospital stay. If you have any questions about your discharge medications or the care you received while you were in the hospital after you are discharged, you can call the unit and asked to speak with the hospitalist on call if the hospitalist that took care of you is not available. Once you are discharged, your primary care physician will handle any further  medical issues. Please note that NO REFILLS for any discharge medications will be authorized once you are discharged, as it is imperative that you return to your primary care physician (or establish a relationship with a primary care physician if you do not have one) for your aftercare needs so that they can reassess your need for medications and monitor your lab values.   Increase activity slowly    Complete by:  As directed       Medication List    STOP taking these medications   ibuprofen 200 MG tablet Commonly known as:  ADVIL,MOTRIN   losartan-hydrochlorothiazide 100-25 MG tablet Commonly known as:  HYZAAR   pseudoephedrine 30 MG tablet Commonly known as:  SUDAFED     TAKE these medications  amLODipine 10 MG tablet Commonly known as:  NORVASC Take 1 tablet (10 mg total) by mouth daily. What changed:  medication strength  how much to take   aspirin 325 MG tablet Take 650 mg by mouth every 4 (four) hours as needed for moderate pain or headache.   furosemide 40 MG tablet Commonly known as:  LASIX Take 1 tablet (40 mg total) by mouth daily.   hydrALAZINE 25 MG tablet Commonly known as:  APRESOLINE Take 1 tablet (25 mg total) by mouth 3 (three) times daily.   labetalol 300 MG tablet Commonly known as:  NORMODYNE Take 1 tablet (300 mg total) by mouth 3 (three) times daily.      Follow-up Information    Lemar Livings, MD Follow up in 4 week(s).   Specialties:  Vascular Surgery, Cardiology Why:  Office will call you to arrange your appt (sent) Contact information: [address] [place] [address] [phone]        Garnetta Buddy, MD. Call in 2 day(s).   Specialty:  Nephrology Contact information: [address] [place] [address] [phone]        Nonnie Done., MD. Schedule an appointment as soon as possible for a visit in 2 week(s).   Specialty:  Family Medicine Contact information: [address] [place] [address] [phone]           Allergies  Allergen Reactions  . No Known Allergies     Consultations:   Nephrology   IR  Vascular surgery   Procedures/Studies: Dg Chest 2 View  Result Date: 05/24/2016 CLINICAL DATA:  Worsening hypertension. EXAM: CHEST  2 VIEW COMPARISON:  None. FINDINGS: Normal heart size. Normal mediastinal contour. No pneumothorax. No pleural effusion. Lungs appear clear, with no acute consolidative airspace disease and no pulmonary edema. IMPRESSION: No active cardiopulmonary disease. Electronically Signed   By: Delbert Phenix M.D.   On: 05/24/2016 15:45   Ct Head Wo Contrast  Result Date: 05/24/2016 CLINICAL DATA:  45 year old male with severe headaches for 5 days and elevated blood pressure. Initial encounter. EXAM: CT HEAD WITHOUT CONTRAST TECHNIQUE: Contiguous axial images were obtained from the base of the skull through the vertex without intravenous contrast. COMPARISON:  None. FINDINGS: Visualized paranasal sinuses and mastoids are well pneumatized. No osseous abnormality identified. Visualized orbits and scalp soft tissues are within normal limits. No midline shift, ventriculomegaly, mass effect, evidence of mass lesion, intracranial hemorrhage or evidence of cortically based acute infarction. Gray-white matter differentiation is within normal limits throughout the brain. No suspicious intracranial vascular hyperdensity. IMPRESSION: Normal noncontrast CT appearance of the brain. Electronically Signed   By: Odessa Fleming M.D.   On: 05/24/2016 15:46   Ct Guided Needle Placement  Result Date: 05/29/2016 INDICATION: 45 year old male with a history of renal failure. Has been referred for medical renal biopsy. EXAM: CT GUIDANCE NEEDLE PLACEMENT MEDICATIONS: None. ANESTHESIA/SEDATION: Moderate (conscious) sedation was employed during this procedure. A total of Versed 4.0 mg and Fentanyl 100 mcg was administered intravenously. Moderate Sedation Time: 15 minutes. The patient's level of consciousness and  vital signs were monitored continuously by radiology nursing throughout the procedure under my direct supervision. FLUOROSCOPY TIME:  CT COMPLICATIONS: None PROCEDURE: Informed written consent was obtained from the patient after a thorough discussion of the procedural risks, benefits and alternatives. All questions were addressed. Maximal Sterile Barrier Technique was utilized including caps, mask, sterile gowns, sterile gloves, sterile drape, hand hygiene and skin antiseptic. A timeout was performed prior to the  initiation of the procedure. Patient position prone position on CT gantry table. Scout CT acquired for planning purposes. The patient is prepped and draped in usual sterile fashion. The skin and subcutaneous tissues were generously infiltrated 1% lidocaine for local anesthesia. Using CT guidance, a 15 gauge needle was advanced into the lateral cortex of the left kidney. 216 gauge core biopsy were achieved. Needle was removed and a final image was stored. Patient tolerated the procedure well and remained hemodynamically stable throughout. No complications were encountered and no significant blood loss encountered. IMPRESSION: Status post CT-guided biopsy of left kidney for medical renal purpose. Tissue specimen sent to pathology for complete histopathologic analysis. Signed, Yvone Neu. Loreta Ave, DO [facility] Radiology Electronically Signed   By: Gilmer Mor D.O.   On: 05/29/2016 11:44   US Renal  Result Date: 05/24/2016 CLINICAL DATA:  Acute kidney injury.  History of hypertension. EXAM: RENAL / URINARY TRACT ULTRASOUND COMPLETE COMPARISON:  None. FINDINGS: Right Kidney: Length: 9.3 cm. Simple appearing cystic lesions demonstrated in the right kidney. Upper pole lesion measures 1.8 cm maximal diameter. Lower pole lesion measures 4 cm maximal diameter. No solid mass lesions identified. Renal parenchymal echotexture appears diffusely increased. Left Kidney:  Length: 12.3 cm. Diffusely increased renal parenchymal echotexture. No solid mass or hydronephrosis identified. Bladder: No bladder wall thickening or filling defects. IMPRESSION: Echogenic renal parenchymal appearance bilaterally suggesting medical renal disease. Benign-appearing cysts in the right kidney. No hydronephrosis. Electronically Signed   By: Burman Nieves M.D.   On: 05/24/2016 21:28   US Abdomen Limited  Result Date: 05/29/2016 CLINICAL DATA:  45 year old male with a history of renal failure. He has been referred for biopsy. EXAM: US ABDOMEN LIMITED - RIGHT UPPER QUADRANT COMPARISON:  05/24/2016 FINDINGS: The procedure, risks, benefits, and alternatives were explained to the patient and the patient's family. Questions regarding the procedure were encouraged and answered. The patient understands and consents to the procedure. Ultrasound survey was performed with images stored and sent to PACs. The patient was then prepped and draped in the usual sterile fashion. The skin and subcutaneous tissues were generously infiltrated 1% lidocaine for local anesthesia. Small stab incision was made with 11 blade scalpel. Using ultrasound guidance, placement of 15 gauge needle was attempted into the right kidney cortex. Given the atrophic and heterogeneous nature of the renal parenchyma, ultrasound confirmation of a safe needle placement into the inferior cortex of the kidney was inadequate. Ultrasound-guided biopsy is deferred for CT guidance. Patient tolerated the procedure well and remained hemodynamically stable throughout. No complications were encountered and no significant blood loss was encounter IMPRESSION: Limited ultrasound study for attempted ultrasound-guided renal biopsy, with inadequate visualization of renal parenchyma for safe and accurate needle placement. Ultrasound imaging deferred for CT imaging for adequate yield. Signed, Yvone Neu. Loreta Ave DO [facility]  [facility] Radiology PLAN: Patient will be transported to CT for completion of the biopsy. Electronically Signed   By: Gilmer Mor D.O.   On: 05/29/2016 10:11    Subjective: Doing well on day of discharge, no headache, no complaints.   Discharge Exam: Vitals:   05/30/16 2103 05/31/16 0702  BP: 132/73 (!) 143/67  Pulse: 72 78  Resp: 19 19  Temp: 98.9 F (37.2 C) 98.3 F (36.8 C)   Vitals:   05/30/16 1700 05/30/16 2010 05/30/16 2103 05/31/16 0702  BP: 134/74 129/78 132/73 (!) 143/67  Pulse: 75 73 72 78  Resp:  (!) 24 19 19  Temp:  98.1 F (36.7 C) 98.9 F (37.2 C) 98.3 F (36.8 C)  TempSrc:  Oral    SpO2:  97% 100% 95%  Weight:   255 lb 1.6 oz (115.7 kg)   Height:   6\' 1"  (1.854 m)     General exam: Appears calm and comfortable  Respiratory system: Respiratory effort normal, no audible wheezing Cardiovascular system: S1 & S2 heard  Central nervous system: Alert and oriented. No gross neurological deficits. Extremities: Fistula site clean dry and swelling improved compared to yesterday.   Skin: No rashes, lesions or ulcers noted in UE Psychiatry: Judgement and insight appear normal. Mood & affect appropriate.     The results of significant diagnostics from this hospitalization (including imaging, microbiology, ancillary and laboratory) are listed below for reference.     Microbiology: Recent Results (from the past 240 hour(s))  MRSA PCR Screening     Status: None   Collection Time: 05/24/16  8:14 PM  Result Value Ref Range Status   MRSA by PCR NEGATIVE NEGATIVE Final    Comment:        The GeneXpert MRSA Assay (FDA approved for NASAL specimens only), is one component of a comprehensive MRSA colonization surveillance program. It is not intended to diagnose MRSA infection nor to guide or monitor treatment for MRSA infections.   Surgical PCR screen     Status: None   Collection Time: 05/28/16  6:48 AM  Result Value Ref Range Status   MRSA, PCR NEGATIVE  NEGATIVE Final   Staphylococcus aureus NEGATIVE NEGATIVE Final    Comment:        The Xpert SA Assay (FDA approved for NASAL specimens in patients over 70 years of age), is one component of a comprehensive surveillance program.  Test performance has been validated by [facility] for patients greater than or equal to 63 year old. It is not intended to diagnose infection nor to guide or monitor treatment.      Labs: BNP (last 3 results)  Recent Labs  05/24/16 1600  BNP 108.2*   Basic Metabolic Panel:  Recent Labs Lab 05/27/16 0332 05/28/16 0351 05/29/16 0344 05/30/16 0338 05/31/16 0452  NA 135 138 137 136 137  K 4.2 4.4 4.1 4.2 3.6  CL 102 103 104 102 101  CO2 21* 21* 20* 20* 20*  GLUCOSE 84 89 90 91 92  BUN 69* 71* 73* 79* 86*  CREATININE 10.76* 11.56* 11.52* 11.39* 12.09*  CALCIUM 8.2* 8.3* 8.4* 8.5* 8.5*  MG  --  1.9  --  2.1  --   PHOS  --  8.1* 8.8* 8.4*  --    Liver Function Tests:  Recent Labs Lab 05/25/16 0248 05/29/16 0344  AST 13*  --   ALT 12*  --   ALKPHOS 63  --   BILITOT 0.2*  --   PROT 5.2*  --   ALBUMIN 2.6* 2.6*   No results for input(s): LIPASE, AMYLASE in the last 168 hours. No results for input(s): AMMONIA in the last 168 hours. CBC:  Recent Labs Lab 05/26/16 0350 05/27/16 0332 05/28/16 0351 05/30/16 0338 05/31/16 0452  WBC 6.4 6.7 7.7 7.0 6.7  NEUTROABS  --   --   --  4.8 4.1  HGB 9.8* 9.8* 9.9* 9.7* 9.0*  HCT 28.4* 28.4* 28.6* 28.0* 26.4*  MCV 83.3 84.5 85.1 85.6 84.6  PLT 226 215 237 263 258   Cardiac Enzymes:  Recent Labs Lab 05/27/16 0807  CKTOTAL 109  BNP: Invalid input(s): POCBNP CBG:  Recent Labs Lab 05/28/16 0731 05/29/16 0738  GLUCAP 87 92   D-Dimer No results for input(s): DDIMER in the last 72 hours. Hgb A1c No results for input(s): HGBA1C in the last 72 hours. Lipid Profile No results for input(s): CHOL, HDL, LDLCALC, TRIG, CHOLHDL, LDLDIRECT in the last 72 hours. Thyroid function  studies No results for input(s): TSH, T4TOTAL, T3FREE, THYROIDAB in the last 72 hours.  Invalid input(s): FREET3 Anemia work up No results for input(s): VITAMINB12, FOLATE, FERRITIN, TIBC, IRON, RETICCTPCT in the last 72 hours. Urinalysis    Component Value Date/Time   COLORURINE YELLOW 05/24/2016 1657   APPEARANCEUR HAZY (A) 05/24/2016 1657   LABSPEC 1.015 05/24/2016 1657   PHURINE 5.5 05/24/2016 1657   GLUCOSEU NEGATIVE 05/24/2016 1657   HGBUR LARGE (A) 05/24/2016 1657   BILIRUBINUR NEGATIVE 05/24/2016 1657   KETONESUR NEGATIVE 05/24/2016 1657   PROTEINUR >300 (A) 05/24/2016 1657   UROBILINOGEN 0.2 01/19/2012 1116   NITRITE NEGATIVE 05/24/2016 1657   LEUKOCYTESUR NEGATIVE 05/24/2016 1657   Sepsis Labs Invalid input(s): PROCALCITONIN,  WBC,  LACTICIDVEN Microbiology Recent Results (from the past 240 hour(s))  MRSA PCR Screening     Status: None   Collection Time: 05/24/16  8:14 PM  Result Value Ref Range Status   MRSA by PCR NEGATIVE NEGATIVE Final    Comment:        The GeneXpert MRSA Assay (FDA approved for NASAL specimens only), is one component of a comprehensive MRSA colonization surveillance program. It is not intended to diagnose MRSA infection nor to guide or monitor treatment for MRSA infections.   Surgical PCR screen     Status: None   Collection Time: 05/28/16  6:48 AM  Result Value Ref Range Status   MRSA, PCR NEGATIVE NEGATIVE Final   Staphylococcus aureus NEGATIVE NEGATIVE Final    Comment:        The Xpert SA Assay (FDA approved for NASAL specimens in patients over 15 years of age), is one component of a comprehensive surveillance program.  Test performance has been validated by [facility] for patients greater than or equal to 38 year old. It is not intended to diagnose infection nor to guide or monitor treatment.      Time coordinating discharge: Over 35 minutes  SIGNED:   Debe Coder, MD  [facility] 05/31/2016, 1:34  PM Pager [phone]  If 7PM-7AM, please contact night-coverage [email or web address] Password [email or web address]

## 2016-06-05 ENCOUNTER — Other Ambulatory Visit: Payer: Self-pay | Admitting: *Deleted

## 2016-06-05 DIAGNOSIS — N186 End stage renal disease: Secondary | ICD-10-CM

## 2016-06-05 DIAGNOSIS — Z4931 Encounter for adequacy testing for hemodialysis: Secondary | ICD-10-CM

## 2016-06-09 ENCOUNTER — Telehealth: Payer: Self-pay | Admitting: Vascular Surgery

## 2016-06-09 NOTE — Telephone Encounter (Signed)
Sched appt 07/27/16; lab at 1:00 and MD at 1:45. Spoke to pt inform them of appt.

## 2016-06-09 NOTE — Telephone Encounter (Signed)
-----   Message from Sharee PimpleMarilyn K McChesney, RN sent at 06/05/2016 10:05 AM EDT ----- Regarding: schedule This is supposed to be 6 weeks.   ----- Message ----- From: Lars MageEmma M Collins, PA-C Sent: 06/05/2016   9:53 AM To: Sharee PimpleMarilyn K McChesney, RN  ^ weeks please ----- Message ----- From: Sharee PimpleMarilyn K McChesney, RN Sent: 05/28/2016  10:43 AM To: Lars MageEmma M Collins, PA-C  Is this for 6 weeks as usual or 4?  ----- Message ----- From: Lars MageEmma M Collins, PA-C Sent: 05/28/2016  10:29 AM To: Vvs Charge Pool  F/U with Dr. Randie Heinzain s/p left AV fistula creation, needs duplex of fistula

## 2016-06-10 ENCOUNTER — Encounter (HOSPITAL_COMMUNITY): Payer: Self-pay

## 2016-07-14 ENCOUNTER — Encounter: Payer: Self-pay | Admitting: Vascular Surgery

## 2016-07-17 ENCOUNTER — Encounter: Payer: Self-pay | Admitting: Vascular Surgery

## 2016-07-17 ENCOUNTER — Ambulatory Visit (INDEPENDENT_AMBULATORY_CARE_PROVIDER_SITE_OTHER): Payer: Self-pay | Admitting: Vascular Surgery

## 2016-07-17 ENCOUNTER — Ambulatory Visit (HOSPITAL_COMMUNITY)
Admission: RE | Admit: 2016-07-17 | Discharge: 2016-07-17 | Disposition: A | Discharge status: Home / Self Care | Payer: BLUE CROSS/BLUE SHIELD | Source: Ambulatory Visit | Attending: Vascular Surgery | Admitting: Vascular Surgery

## 2016-07-17 VITALS — BP 115/67 | HR 69 | Temp 98.0°F | Resp 16 | Ht 73.0 in | Wt 237.9 lb

## 2016-07-17 DIAGNOSIS — Z4931 Encounter for adequacy testing for hemodialysis: Secondary | ICD-10-CM | POA: Insufficient documentation

## 2016-07-17 DIAGNOSIS — N186 End stage renal disease: Secondary | ICD-10-CM | POA: Diagnosis not present

## 2016-07-17 DIAGNOSIS — N184 Chronic kidney disease, stage 4 (severe): Secondary | ICD-10-CM

## 2016-07-17 NOTE — Progress Notes (Signed)
Subjective:     Patient ID: [name], male   DOB: 11/09/1970, 45 y.o.   MRN: [id number]  HPI this [name] returns from recent placement of left upper extremity brachial cephalic AV fistula placement. He has had some sensations of burning in his arm in the middle of his sleep but otherwise has no hand pain and is continuing to work as a [employer]. He is not currently on dialysis and is followed by Dr. Hyman HopesWebb. He has no complaints regarding today's visit.    Review of Systems No complaints today.    Objective:   Physical Exam  Constitutional: He appears well-developed.  Cardiovascular: Normal rate.   Pulmonary/Chest: Effort normal.  Musculoskeletal:  Left hand 5/5 strength Palpable left radial pulse Thrill in proximal avf, palpable large branch, upper arm fistula is weak    Dialysis fistula duplex demonstrates a patent fistula with increased velocity in the distal upper arm and area of narrowing with a velocity of 5 79 cm/s.     Assessment:     45 year old white male status post placement left upper extremity AV fistula. He has a palpable thrill proximally in the fistula with weakening distally and a palpable large branch.    Plan:     I discussed with [name] likelihood that he would need further intervention prior to using his fistula. This fistula will not be ready for another month anyway and I would not want to give him contrast prior to this. If he does need dialysis in the fistula can be cannulated with an attempted dialysis. However I believe he would likely need fistulogram with balloon angioplasty and possible coil of a side branch. We will pursue this if and when needed. He'll otherwise return on a when necessary basis.  Tavoris Brisk C. Randie Heinzain, MD [facility] [place] Office: [phone] Pager: [phone]

## 2016-09-18 ENCOUNTER — Other Ambulatory Visit: Payer: Self-pay | Admitting: Internal Medicine

## 2016-09-21 ENCOUNTER — Other Ambulatory Visit: Payer: Self-pay | Admitting: Internal Medicine

## 2016-09-22 ENCOUNTER — Other Ambulatory Visit: Payer: Self-pay | Admitting: Internal Medicine

## 2016-09-22 ENCOUNTER — Encounter (HOSPITAL_COMMUNITY): Payer: Self-pay | Admitting: *Deleted

## 2016-09-22 ENCOUNTER — Emergency Department (HOSPITAL_COMMUNITY)
Admission: EM | Admit: 2016-09-22 | Discharge: 2016-09-22 | Disposition: A | Discharge status: Home / Self Care | Payer: BLUE CROSS/BLUE SHIELD | Attending: Emergency Medicine | Admitting: Emergency Medicine

## 2016-09-22 DIAGNOSIS — Z7982 Long term (current) use of aspirin: Secondary | ICD-10-CM | POA: Diagnosis not present

## 2016-09-22 DIAGNOSIS — N186 End stage renal disease: Secondary | ICD-10-CM | POA: Diagnosis not present

## 2016-09-22 DIAGNOSIS — I120 Hypertensive chronic kidney disease with stage 5 chronic kidney disease or end stage renal disease: Secondary | ICD-10-CM | POA: Insufficient documentation

## 2016-09-22 DIAGNOSIS — R944 Abnormal results of kidney function studies: Secondary | ICD-10-CM | POA: Diagnosis present

## 2016-09-22 LAB — BASIC METABOLIC PANEL
ANION GAP: 18 — AB (ref 5–15)
BUN: 154 mg/dL — ABNORMAL HIGH (ref 6–20)
CHLORIDE: 104 mmol/L (ref 101–111)
CO2: 13 mmol/L — AB (ref 22–32)
Calcium: 8 mg/dL — ABNORMAL LOW (ref 8.9–10.3)
Creatinine, Ser: 14.83 mg/dL — ABNORMAL HIGH (ref 0.61–1.24)
GFR calc Af Amer: 4 mL/min — ABNORMAL LOW (ref >60)
GFR calc non Af Amer: 3 mL/min — ABNORMAL LOW (ref >60)
GLUCOSE: 104 mg/dL — AB (ref 65–99)
POTASSIUM: 4.3 mmol/L (ref 3.5–5.1)
Sodium: 135 mmol/L (ref 135–145)

## 2016-09-22 LAB — CBC
HEMATOCRIT: 27.1 % — AB (ref 39.0–52.0)
HEMOGLOBIN: 9.7 g/dL — AB (ref 13.0–17.0)
MCH: 27.9 pg (ref 26.0–34.0)
MCHC: 35.8 g/dL (ref 30.0–36.0)
MCV: 77.9 fL — AB (ref 78.0–100.0)
Platelets: 266 10*3/uL (ref 150–400)
RBC: 3.48 MIL/uL — AB (ref 4.22–5.81)
RDW: 12.2 % (ref 11.5–15.5)
WBC: 6.7 10*3/uL (ref 4.0–10.5)

## 2016-09-22 NOTE — ED Triage Notes (Signed)
Pt reports hx of renal failure but has not yet started dialysis. Renal called him today and told him to come to ED due to increase in renal function and needing to start dialysis. Has new fistula to left arm. No complaints other than generalized fatigue.

## 2016-09-22 NOTE — ED Notes (Signed)
ED Provider at bedside. 

## 2016-09-22 NOTE — ED Notes (Signed)
Pt states he understands instructions. HOme stable with wife. Ambulaltes easily to dc.

## 2016-09-22 NOTE — Consult Note (Signed)
[name] Admit Date: 09/22/2016 09/22/2016 [name] MissSANFORD, [name]:  [name] MD  Reason for Consult:  Uremia HPI:  73M progressive CKD5 follows at [facility] with Dr. Hyman HopesWebb who was instructed to go to ED because of worsened labs.  Has had known CKD5 on edge of RRT.  [facility] and labs returned markedly worsened.  Has L UE AVF BVT which is has a large accessory vein and its usability is questionable.   He endorses 7d of N/V, poor PO, dysgeusia, fatigue, poor sleep. No CP, SOB.  BP stable here. Has known LEE, 1-2+.      Creatinine, Ser (mg/dL)  Date Value  95/62/130812/09/2016 14.83 (H)  05/31/2016 12.09 (H)  05/30/2016 11.39 (H)  05/29/2016 11.52 (H)  05/28/2016 11.56 (H)  05/27/2016 10.76 (H)  05/26/2016 10.04 (H)  05/25/2016 9.20 (H)  05/24/2016 9.30 (H)  01/19/2012 0.97  ] I/Os: No intake/output data recorded.  ROS Balance of 12 systems is negative w/ exceptions as above  PMH  Past Medical History:  Diagnosis Date  . HNP (herniated nucleus pulposus), lumbar   . Hypertension   . Kidney problem    PSH  Past Surgical History:  Procedure Laterality Date  . AV FISTULA PLACEMENT Left 05/28/2016   Procedure: LEFT ARM BRACHIOCEPHALIC ARTERIOVENOUS (AV) FISTULA CREATION;  Surgeon: Maeola HarmanBrandon Jibreel Cain, MD;  [facility];  Service: Vascular;  Laterality: Left;  . BACK SURGERY    . HERNIA REPAIR  2009  . REFRACTIVE SURGERY     FH  Family History  Problem Relation Age of Onset  . Hypertension Mother   . Diabetes Mother   . COPD Father   . Hypertension Brother    SH  reports that he has never smoked. His smokeless tobacco use includes Chew. He reports that he drinks alcohol. He reports that he does not use drugs. Allergies  Allergies  Allergen Reactions  . No Known Allergies    Home medications Prior to Admission medications   Medication Sig Start Date End Date Taking? Authorizing Provider  amLODipine (NORVASC) 10 MG tablet Take 1  tablet (10 mg total) by mouth daily. 05/31/16  Yes Inez CatalinaEmily B Mullen, MD  furosemide (LASIX) 40 MG tablet Take 1 tablet (40 mg total) by mouth daily. Patient taking differently: Take 40 mg by mouth 2 (two) times daily.  05/31/16  Yes Inez CatalinaEmily B Mullen, MD  labetalol (NORMODYNE) 300 MG tablet Take 1 tablet (300 mg total) by mouth 3 (three) times daily. Patient taking differently: Take 150 mg by mouth 2 (two) times daily.  05/31/16  Yes Inez CatalinaEmily B Mullen, MD  aspirin 325 MG tablet Take 650 mg by mouth every 4 (four) hours as needed for moderate pain or headache.    Historical Provider, MD  hydrALAZINE (APRESOLINE) 25 MG tablet Take 1 tablet (25 mg total) by mouth 3 (three) times daily. Patient not taking: Reported on 09/22/2016 05/31/16   Inez CatalinaEmily B Mullen, MD    Current Medications Scheduled Meds: Continuous Infusions: PRN Meds:.  CBC  Recent Labs Lab 09/22/16 1042  WBC 6.7  HGB 9.7*  HCT 27.1*  MCV 77.9*  PLT 266   Basic Metabolic Panel  Recent Labs Lab 09/22/16 1042  NA 135  K 4.3  CL 104  CO2 13*  GLUCOSE 104*  BUN 154*  CREATININE 14.83*  CALCIUM 8.0*    Physical Exam  Blood pressure 118/78, pulse 73, temperature 97.8 F (36.6 C), temperature source Oral, resp. rate  16, height 6\' 1"  (1.854 m), weight 106.6 kg (235 lb), SpO2 99 %. GEN: NAD, appears well, nl wob ENT: NCAT EYES: EOMI CV: RRR, no rub, nl s1s2 PULM: CTAB, nl wob ABD: s/nd/nd SKIN: no rashes EXT:1+ LEE LUE AVF +B/T, large side branch obvious but has about 3inches for cannulation available   Assessment Pt is now ESRD and uremic but this is mild and w/o any life threatening issues currently.  OK to initation HD as an outpatient and Dr. Hyman HopesWebb will arrange CLIP.  Will attempt to use AVF and if trouble[facility]infiltration[facility]  [facility]  1. New ESRD 2. Uremia 3. FSGS 4. LUE AVF, marginal for use  Plan 1. DC from ED 2. We will CLIP from our office, Dr. Hyman HopesWebb  aware   Sabra Heckyan Dicie Edelen MD [phone] pgr 09/22/2016, 2:31 PM

## 2016-09-22 NOTE — ED Provider Notes (Signed)
[facility]-EM[facility]ENCY DEPT Provider Note   CSN: [id number] Arrival date & time: 09/22/16  1010     History   Chief Complaint Chief Complaint  Patient presents with  . Abnormal Lab  . Vascular Access Problem    HPI Ronald HolmesChristopher S Lamphear is a 45 y.o. male.  The history is provided by the patient. No language interpreter was used.  Abnormal Lab    Ronald HolmesChristopher S Stone is a [age] y.o. male who presents to the Emergency Department complaining of abnormal lab.  He has a history of CKD and is [facility] for possible [facility] in the future. He had labs performed today with elevated BUN and he was informed to present to the emergency department for evaluation. He reports increased nausea and malaise starting today with vomiting once. He has mild chronic diarrhea that is unchanged from baseline. He also has chronic lower extremity edema that is unchanged. He denies any fevers, abdominal pain, chest pain, shortness breath. He had a fistula placed in the left upper extremity 3 months ago but it has not been use.  Past Medical History:  Diagnosis Date  . HNP (herniated nucleus pulposus), lumbar   . Hypertension   . Kidney problem     Patient Active Problem List   Diagnosis Date Noted  . Acute on chronic kidney failure (HCC)   . Hypertensive emergency 05/24/2016  . AKI (acute kidney injury) (HCC) 05/24/2016  . Anemia 05/24/2016  . Cephalalgia 05/24/2016    Past Surgical History:  Procedure Laterality Date  . AV FISTULA PLACEMENT Left 05/28/2016   Procedure: LEFT ARM BRACHIOCEPHALIC ARTERIOVENOUS (AV) FISTULA CREATION;  Surgeon: Maeola HarmanBrandon Yossi Cain, MD;  [facility];  Service: Vascular;  Laterality: Left;  . BACK SURGERY    . HERNIA REPAIR  2009  . REFRACTIVE SURGERY         Home Medications    Prior to Admission medications   Medication Sig Start Date End Date Taking? Authorizing Provider  amLODipine (NORVASC) 10 MG  tablet Take 1 tablet (10 mg total) by mouth daily. 05/31/16  Yes Inez CatalinaEmily B Mullen, MD  furosemide (LASIX) 40 MG tablet Take 1 tablet (40 mg total) by mouth daily. Patient taking differently: Take 40 mg by mouth 2 (two) times daily.  05/31/16  Yes Inez CatalinaEmily B Mullen, MD  labetalol (NORMODYNE) 300 MG tablet Take 1 tablet (300 mg total) by mouth 3 (three) times daily. Patient taking differently: Take 150 mg by mouth 2 (two) times daily.  05/31/16  Yes Inez CatalinaEmily B Mullen, MD  aspirin 325 MG tablet Take 650 mg by mouth every 4 (four) hours as needed for moderate pain or headache.    Historical Provider, MD  hydrALAZINE (APRESOLINE) 25 MG tablet Take 1 tablet (25 mg total) by mouth 3 (three) times daily. Patient not taking: Reported on 09/22/2016 05/31/16   Inez CatalinaEmily B Mullen, MD    Family History Family History  Problem Relation Age of Onset  . Hypertension Mother   . Diabetes Mother   . COPD Father   . Hypertension Brother     Social History Social History  Substance Use Topics  . Smoking status: Never Smoker  . Smokeless tobacco: Current User    Types: Chew  . Alcohol use Yes     Comment: occ     Allergies   No known allergies   Review of Systems Review of Systems  All other systems reviewed and are negative.  Physical Exam Updated Vital Signs BP 118/78   Pulse 73   Temp 97.8 F (36.6 C) (Oral)   Resp 16   Ht 6\' 1"  (1.854 m)   Wt 235 lb (106.6 kg)   SpO2 99%   BMI 31.00 kg/m   Physical Exam  Constitutional: He is oriented to person, place, and time. He appears well-developed and well-nourished.  HENT:  Head: Normocephalic and atraumatic.  Cardiovascular: Normal rate and regular rhythm.   No murmur heard. Pulmonary/Chest: Effort normal and breath sounds normal. No respiratory distress.  Abdominal: Soft. There is no tenderness. There is no rebound and no guarding.  Musculoskeletal: He exhibits no tenderness.  Fistula in the left upper extremity with palpable thrill, 1+ edema  to bilateral ankles.  Neurological: He is alert and oriented to person, place, and time.  Skin: Skin is warm and dry.  Psychiatric: He has a normal mood and affect. His behavior is normal.  Nursing note and vitals reviewed.    ED Treatments / Results  Labs (all labs ordered are listed, but only abnormal results are displayed) Labs Reviewed  CBC - Abnormal; Notable for the following:       Result Value   RBC 3.48 (*)    Hemoglobin 9.7 (*)    HCT 27.1 (*)    MCV 77.9 (*)    All other components within normal limits  BASIC METABOLIC PANEL - Abnormal; Notable for the following:    CO2 13 (*)    Glucose, Bld 104 (*)    BUN 154 (*)    Creatinine, Ser 14.83 (*)    Calcium 8.0 (*)    GFR calc non Af Amer 3 (*)    GFR calc Af Amer 4 (*)    Anion gap 18 (*)    All other components within normal limits    EKG  EKG Interpretation None       Radiology No results found.  Procedures Procedures (including critical care time)  Medications Ordered in ED Medications - No data to display   Initial Impression / Assessment and Plan / ED Course  I have reviewed the triage vital signs and the nursing notes.  Pertinent labs & imaging results that were available during my care of the patient were reviewed by me and considered in my medical decision making (see chart for details).  Clinical Course     Patient with history of end-stage renal disease not currently on dialysis but does have access available. He is in the emergency department with nausea and elevated BUN and creatinine. His electrolytes are otherwise appropriate. He is not clinically volume overloaded with clear lungs on exam and no respiratory symptoms. Discussed with Dr. Marisue HumbleSanford with nephrology who saw the patient in the emergency department. Plan to DC home to initiate outpatient dialysis this week. Discussed with patient home care, return precautions.  Final Clinical Impressions(s) / ED Diagnoses   Final diagnoses:    ESRD (end stage renal disease) [facility](HCC)    New Prescriptions Discharge Medication List as of 09/22/2016  2:11 PM       Tilden FossaElizabeth Hinley Brimage, MD 09/22/16 1537

## 2016-09-22 NOTE — ED Notes (Signed)
Nephrologist at bedside

## 2016-09-25 ENCOUNTER — Other Ambulatory Visit: Payer: Self-pay | Admitting: Internal Medicine

## 2016-09-28 ENCOUNTER — Other Ambulatory Visit: Payer: Self-pay | Admitting: Internal Medicine

## 2016-10-18 ENCOUNTER — Other Ambulatory Visit: Payer: Self-pay | Admitting: Internal Medicine

## 2017-02-28 ENCOUNTER — Other Ambulatory Visit: Payer: Self-pay | Admitting: Internal Medicine

## 2017-05-22 IMAGING — CT CT GUIDANCE NEEDLE PLACEMENT
1 of 2 series · 15 of 32 positions shown, 19 images · non-contrast
Comparison: none

INDICATION: 44-year-old male with a history of renal failure. Has been referred
for medical renal biopsy.

[Series 2: i-spiral 5.0 b40f · axial · 0.98mm/px · z∈[+1118,+1321]mm · 15 of 64 slices shown, 19 images]
[im 3/64  soft-tissue]
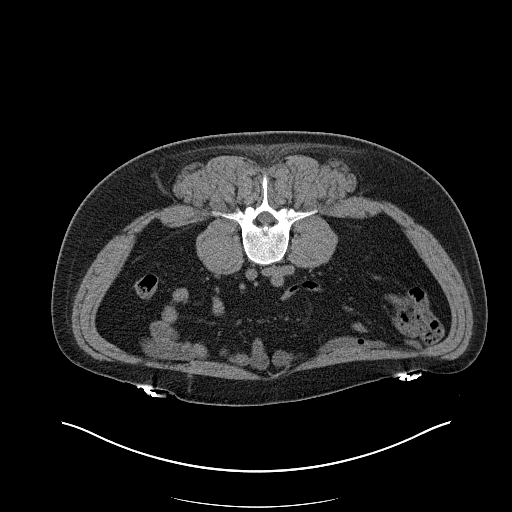
[im 3/64  bone]
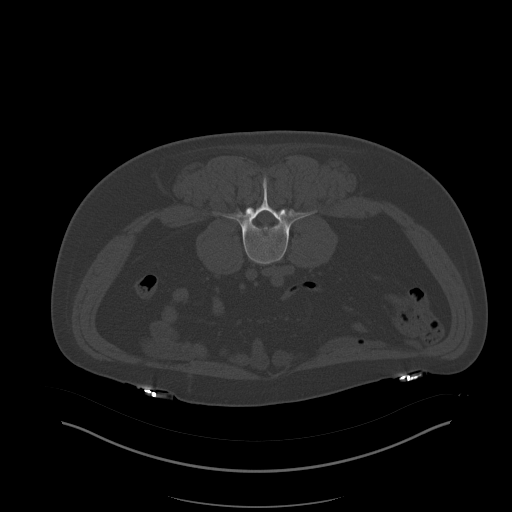
[im 8/64  soft-tissue]
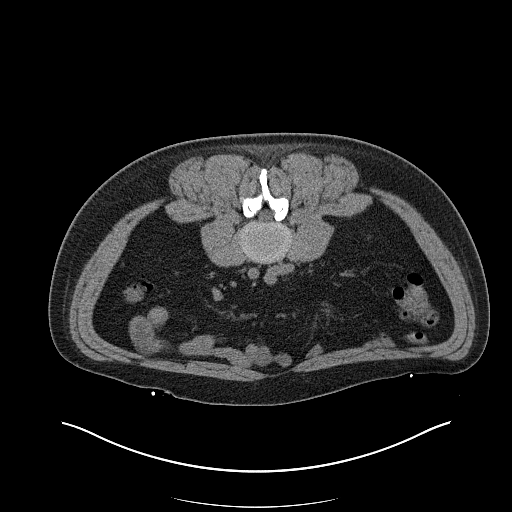
[im 14/64  soft-tissue]
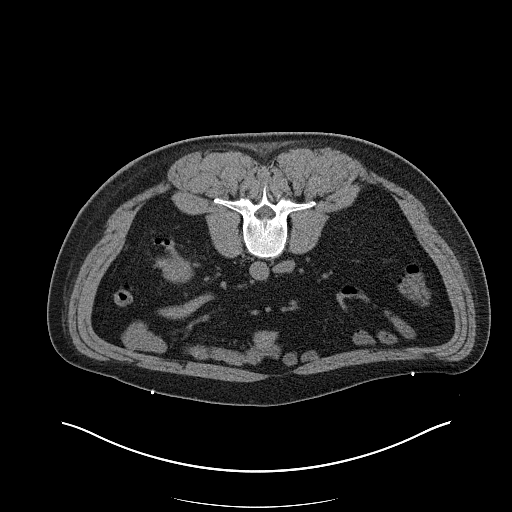
[im 19/64  soft-tissue]
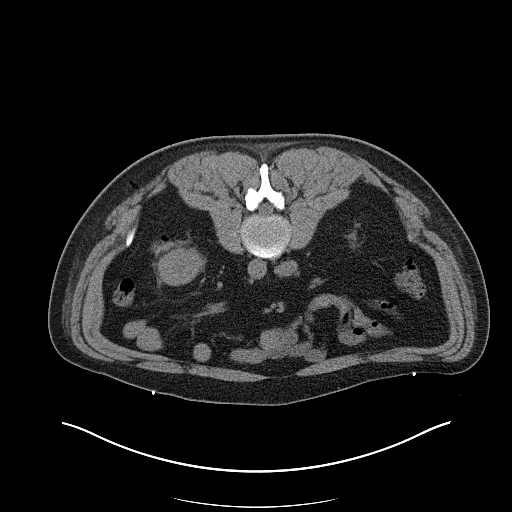
[im 22/64  soft-tissue]
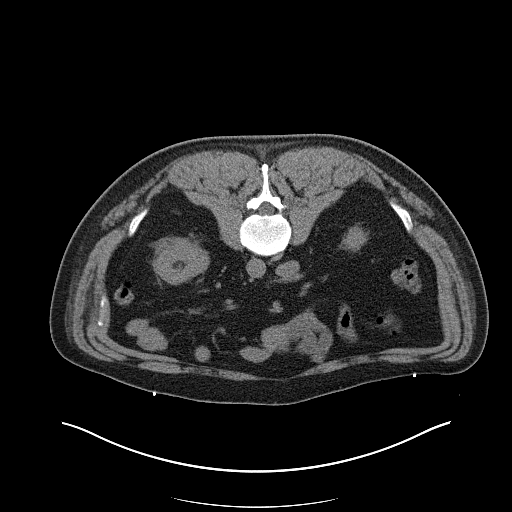
[im 27/64  soft-tissue]
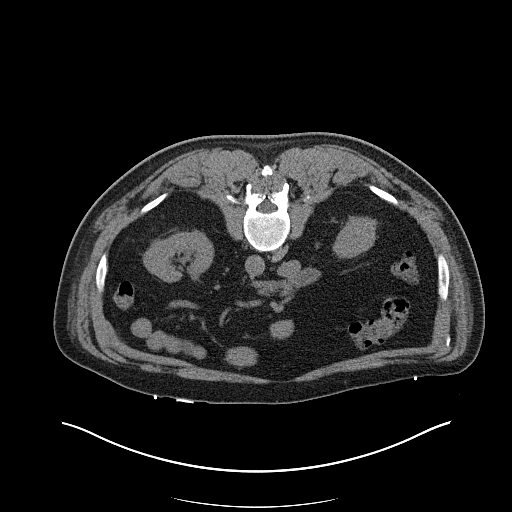
[im 32/64  soft-tissue]
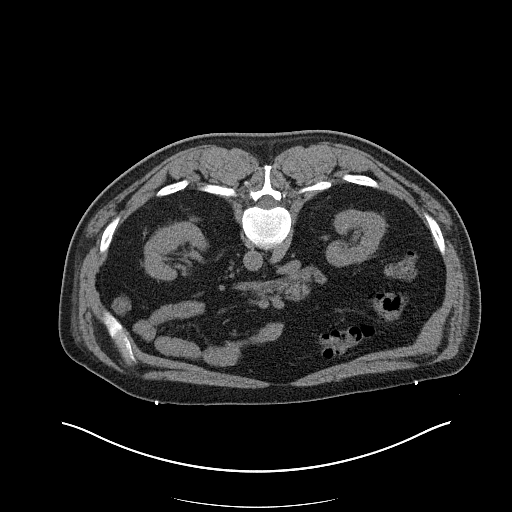
[im 37/64  soft-tissue]
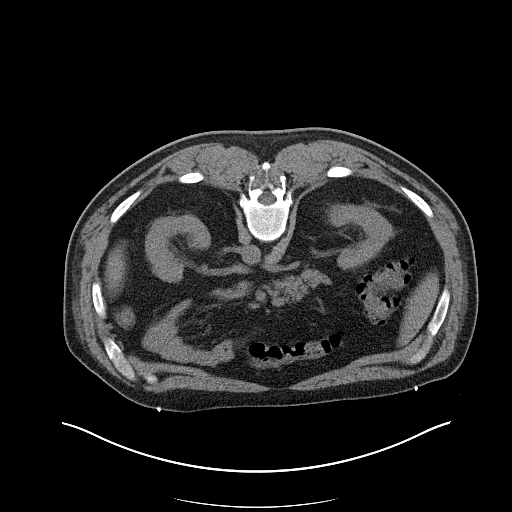
[im 43/64  soft-tissue]
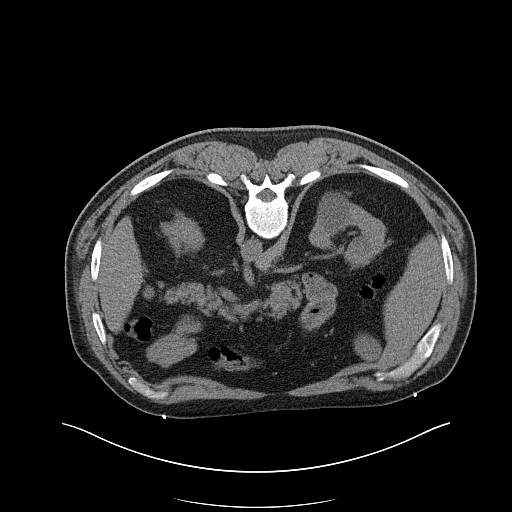
[im 43/64  bone]
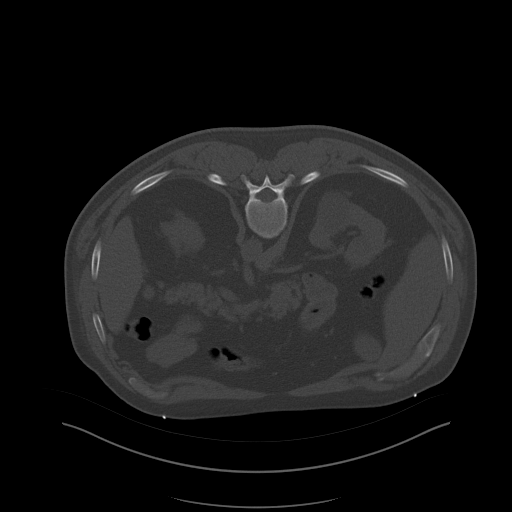
[im 45/64  soft-tissue]
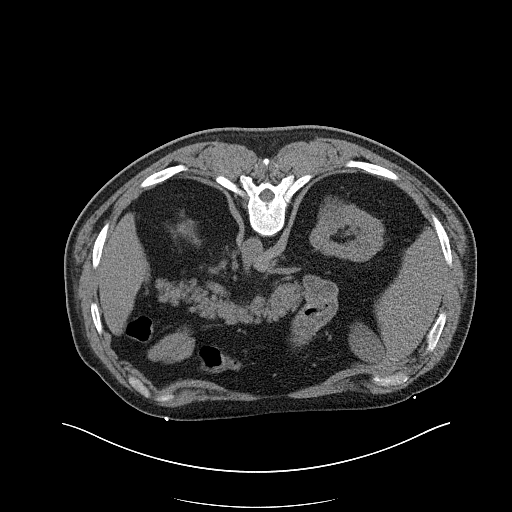
[im 50/64  soft-tissue]
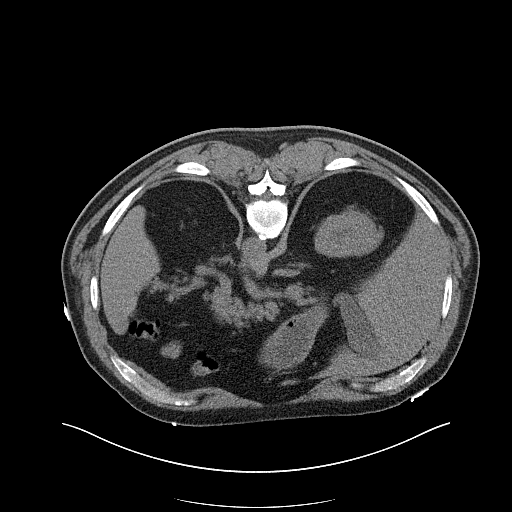
[im 53/64  lung]
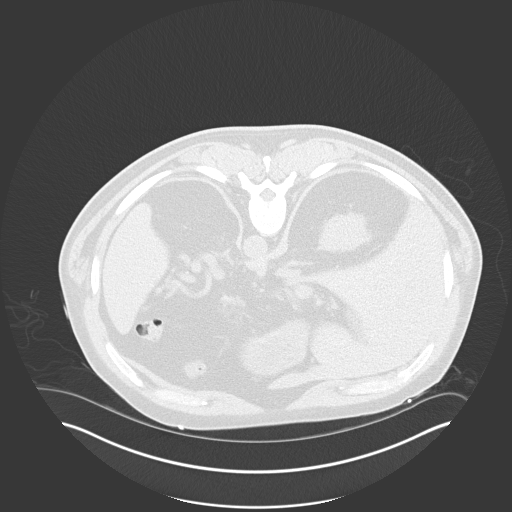
[im 56/64  soft-tissue]
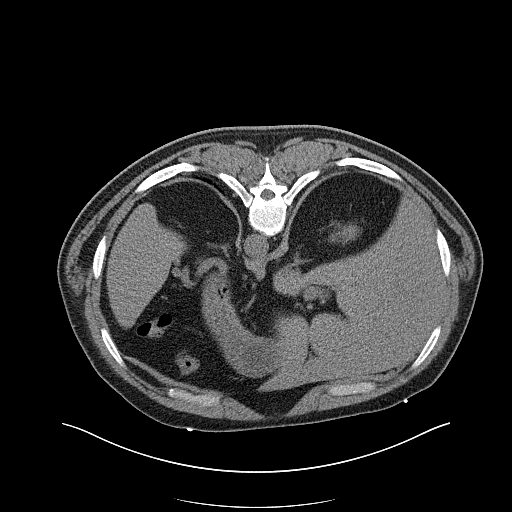
[im 56/64  lung]
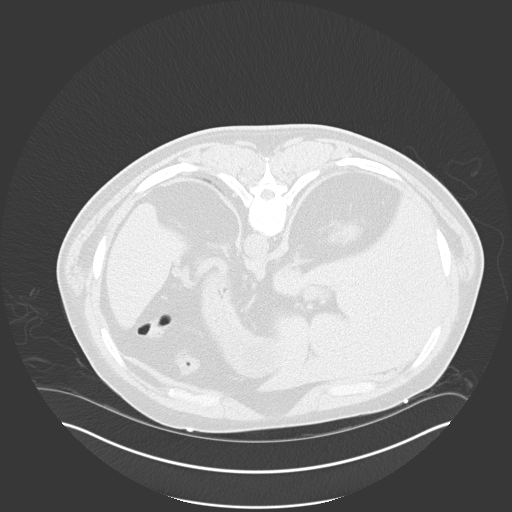
[im 58/64  lung]
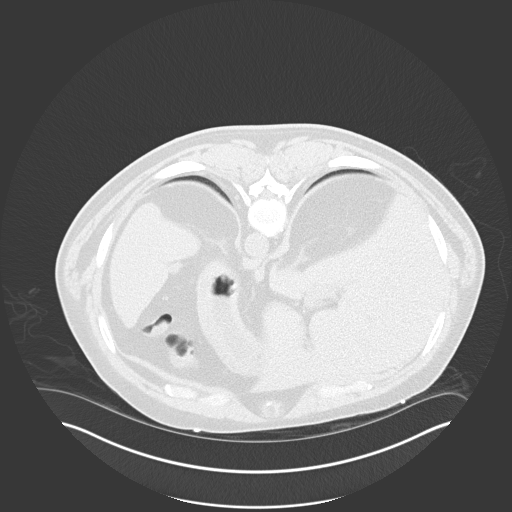
[im 61/64  soft-tissue]
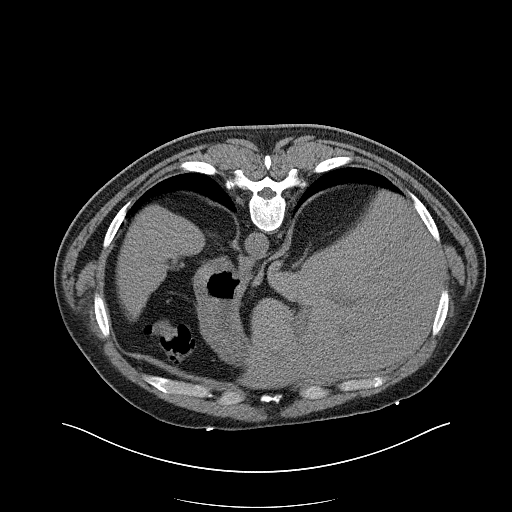
[im 61/64  lung]
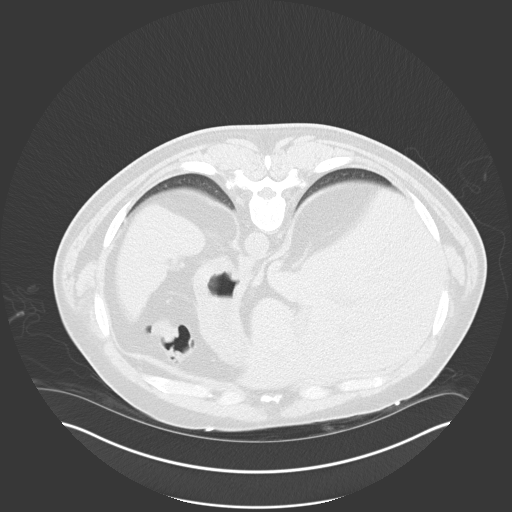

[15 of 32 positions shown; findings below may reference images not displayed]

EXAM:
CT GUIDANCE NEEDLE PLACEMENT

MEDICATIONS:
None.

ANESTHESIA/SEDATION:
Moderate (conscious) sedation was employed during this procedure. A
total of Versed 4.0 mg and Fentanyl 100 mcg was administered
intravenously.

Moderate Sedation Time: 15 minutes. The patient's level of
consciousness and vital signs were monitored continuously by
radiology nursing throughout the procedure under my direct
supervision.

FLUOROSCOPY TIME:  CT

COMPLICATIONS:
None

PROCEDURE:
Informed written consent was obtained from the patient after a
thorough discussion of the procedural risks, benefits and
alternatives. All questions were addressed. Maximal Sterile Barrier
Technique was utilized including caps, mask, sterile gowns, sterile
gloves, sterile drape, hand hygiene and skin antiseptic. A timeout
was performed prior to the initiation of the procedure.

Patient position prone position on CT gantry table. Scout CT
acquired for planning purposes.

The patient is prepped and draped in usual sterile fashion. The skin
and subcutaneous tissues were generously infiltrated 1% lidocaine
for local anesthesia. Using CT guidance, a 15 gauge needle was
advanced into the lateral cortex of the left kidney. 216 gauge core
biopsy were achieved.

Needle was removed and a final image was stored.

Patient tolerated the procedure well and remained hemodynamically
stable throughout.

No complications were encountered and no significant blood loss
encountered.
IMPRESSION: Status post CT-guided biopsy of left kidney for medical renal
purpose. Tissue specimen sent to pathology for complete
histopathologic analysis.

## 2020-09-21 ENCOUNTER — Other Ambulatory Visit: Payer: Self-pay

## 2020-09-21 ENCOUNTER — Encounter (HOSPITAL_COMMUNITY): Payer: Self-pay

## 2020-09-21 ENCOUNTER — Emergency Department (HOSPITAL_COMMUNITY)
Admission: EM | Admit: 2020-09-21 | Discharge: 2020-09-21 | Disposition: A | Discharge status: Home / Self Care | Payer: BC Managed Care – PPO | Attending: Emergency Medicine | Admitting: Emergency Medicine

## 2020-09-21 DIAGNOSIS — Z5321 Procedure and treatment not carried out due to patient leaving prior to being seen by health care provider: Secondary | ICD-10-CM | POA: Diagnosis not present

## 2020-09-21 DIAGNOSIS — R1110 Vomiting, unspecified: Secondary | ICD-10-CM | POA: Diagnosis not present

## 2020-09-21 DIAGNOSIS — R630 Anorexia: Secondary | ICD-10-CM | POA: Insufficient documentation

## 2020-09-21 DIAGNOSIS — R140 Abdominal distension (gaseous): Secondary | ICD-10-CM | POA: Diagnosis not present

## 2020-09-21 DIAGNOSIS — R197 Diarrhea, unspecified: Secondary | ICD-10-CM | POA: Insufficient documentation

## 2020-09-21 LAB — URINALYSIS, ROUTINE W REFLEX MICROSCOPIC
Bacteria, UA: NONE SEEN
Glucose, UA: NEGATIVE mg/dL
Hgb urine dipstick: NEGATIVE
Ketones, ur: NEGATIVE mg/dL
Leukocytes,Ua: NEGATIVE
Nitrite: NEGATIVE
Protein, ur: 30 mg/dL — AB
Specific Gravity, Urine: 1.031 — ABNORMAL HIGH (ref 1.005–1.030)
pH: 5 (ref 5.0–8.0)

## 2020-09-21 LAB — COMPREHENSIVE METABOLIC PANEL
ALT: 39 U/L (ref 0–44)
AST: 24 U/L (ref 15–41)
Albumin: 4.4 g/dL (ref 3.5–5.0)
Alkaline Phosphatase: 65 U/L (ref 38–126)
Anion gap: 10 (ref 5–15)
BUN: 14 mg/dL (ref 6–20)
CO2: 26 mmol/L (ref 22–32)
Calcium: 9.7 mg/dL (ref 8.9–10.3)
Chloride: 99 mmol/L (ref 98–111)
Creatinine, Ser: 1.91 mg/dL — ABNORMAL HIGH (ref 0.61–1.24)
GFR, Estimated: 42 mL/min — ABNORMAL LOW (ref >60)
Glucose, Bld: 114 mg/dL — ABNORMAL HIGH (ref 70–99)
Potassium: 4.2 mmol/L (ref 3.5–5.1)
Sodium: 135 mmol/L (ref 135–145)
Total Bilirubin: 1.6 mg/dL — ABNORMAL HIGH (ref 0.3–1.2)
Total Protein: 7.8 g/dL (ref 6.5–8.1)

## 2020-09-21 LAB — CBC
HCT: 53 % — ABNORMAL HIGH (ref 39.0–52.0)
Hemoglobin: 17.9 g/dL — ABNORMAL HIGH (ref 13.0–17.0)
MCH: 30.5 pg (ref 26.0–34.0)
MCHC: 33.8 g/dL (ref 30.0–36.0)
MCV: 90.3 fL (ref 80.0–100.0)
Platelets: 300 10*3/uL (ref 150–400)
RBC: 5.87 MIL/uL — ABNORMAL HIGH (ref 4.22–5.81)
RDW: 13.5 % (ref 11.5–15.5)
WBC: 11.6 10*3/uL — ABNORMAL HIGH (ref 4.0–10.5)
nRBC: 0 % (ref 0.0–0.2)

## 2020-09-21 LAB — LIPASE, BLOOD: Lipase: 24 U/L (ref 11–51)

## 2020-09-21 NOTE — ED Triage Notes (Signed)
Patient complains of abdominal bloating and loose stools with vomiting on Wednesday after eating ribs. Patient reports that he has decreased appetite and loose stools and concerned for blockage

## 2020-09-21 NOTE — ED Notes (Signed)
Pt told reg that he was leaving

## 2021-10-15 ENCOUNTER — Encounter: Payer: Self-pay | Admitting: Nephrology

## 2023-07-07 ENCOUNTER — Ambulatory Visit: Payer: BC Managed Care – PPO

## 2023-07-07 NOTE — Progress Notes (Deleted)
HISTORY AND PHYSICAL     CC:  dialysis access Requesting Provider:  Nonnie Done., MD  HPI: This is a 52 y.o. male here for evaluation of his hemodialysis access.    Pt has hx of left BC AVF 05/28/2016 by Dr. Randie Heinz.   Dialysis access history: -see above.   The pt is *** hand dominant.    Pt *** on dialysis.   Days of dialysis if applicable:  ***    HD center:  ***  location.   The pt is not on a statin for cholesterol management.  The pt is on a daily aspirin.  Other AC:  none The pt is on BB, CCB, diuretic for hypertension.  The pt is not on medication for diabetes.   Tobacco hx:  never  Past Medical History:  Diagnosis Date   HNP (herniated nucleus pulposus), lumbar    Hypertension    Kidney problem     Past Surgical History:  Procedure Laterality Date   AV FISTULA PLACEMENT Left 05/28/2016   Procedure: LEFT ARM BRACHIOCEPHALIC ARTERIOVENOUS (AV) FISTULA CREATION;  Surgeon: Maeola Harman, MD;  Location: [facility] OR;  Service: Vascular;  Laterality: Left;   BACK SURGERY     HERNIA REPAIR  2009   REFRACTIVE SURGERY      Allergies  Allergen Reactions   No Known Allergies     Current Outpatient Medications  Medication Sig Dispense Refill   amLODipine (NORVASC) 10 MG tablet Take 1 tablet (10 mg total) by mouth daily. 30 tablet 3   aspirin 325 MG tablet Take 650 mg by mouth every 4 (four) hours as needed for moderate pain or headache.     furosemide (LASIX) 40 MG tablet Take 1 tablet (40 mg total) by mouth daily. (Patient taking differently: Take 40 mg by mouth 2 (two) times daily. ) 30 tablet 3   hydrALAZINE (APRESOLINE) 25 MG tablet Take 1 tablet (25 mg total) by mouth 3 (three) times daily. (Patient not taking: Reported on 09/22/2016) 90 tablet 3   labetalol (NORMODYNE) 300 MG tablet Take 1 tablet (300 mg total) by mouth 3 (three) times daily. (Patient taking differently: Take 150 mg by mouth 2 (two) times daily. ) 90 tablet 3   No current  facility-administered medications for this visit.    Family History  Problem Relation Age of Onset   Hypertension Mother    Diabetes Mother    COPD Father    Hypertension Brother     Social History   Socioeconomic History   Marital status: Married    Spouse name: Not on file   Number of children: Not on file   Years of education: Not on file   Highest education level: Not on file  Occupational History   Not on file  Tobacco Use   Smoking status: Never   Smokeless tobacco: Current    Types: Chew  Substance and Sexual Activity   Alcohol use: Yes    Comment: occ   Drug use: No   Sexual activity: Not on file  Other Topics Concern   Not on file  Social History Narrative   Not on file   Social Determinants of Health   Financial Resource Strain: Not on file  Food Insecurity: Not on file  Transportation Needs: Not on file  Physical Activity: Not on file  Stress: Not on file  Social Connections: Not on file  Intimate Partner Violence: Not on file     ROS: [x]  Positive   [ ]   Negative   [ ]  All sytems reviewed and are negative *** Cardiac: []  chest pain/pressure []  SOB/DOE  Vascular: []  pain in legs while walking []  pain in feet when lying flat []  swelling in legs  Pulmonary: []  asthma []  wheezing  Neurologic: []  hx CVA/TIA  Hematologic: []  bleeding problems  GI []  GERD  GU: [x]  CKD/renal failure  [x]  HD---[]  M/W/F []  T/T/S  Psychiatric: []  hx of major depression  Integumentary: []  rashes []  ulcers  Constitutional: []  fever []  chills   PHYSICAL EXAMINATION:  ***   General:  WDWN male in NAD Gait: Not observed HENT: WNL Pulmonary: normal non-labored breathing  Cardiac: {Desc; regular/irreg:14544}, {With/Without:20273} carotid bruit*** Abdomen: soft, NT Skin: {With/Without:20273} rashes Vascular Exam/Pulses:   Right Left  Radial {Exam; arterial pulse strength 0-4:30167} {Exam; arterial pulse strength 0-4:30167}  Ulnar {Exam;  arterial pulse strength 0-4:30167} {Exam; arterial pulse strength 0-4:30167}   Extremities:  *** Musculoskeletal: no muscle wasting or atrophy  Neurologic: A&O X 3   ASSESSMENT/PLAN: 52 y.o. male with ESRD here for evaluation of his hemodialysis access with hx of left BC AVF 05/28/2016 by Dr. Randie Heinz   -*** -pt *** on dialysis on *** -discussed with pt that access does not last forever and will need intervention or even new access at some point.  -pt is *** hand dominant - will plan for *** -pt *** on anticoagulation   Doreatha Massed, [facility] [phone]  Clinic MD:   Randie Heinz

## 2023-08-11 ENCOUNTER — Encounter: Payer: Self-pay | Admitting: Vascular Surgery

## 2023-08-11 ENCOUNTER — Ambulatory Visit: Payer: BC Managed Care – PPO | Admitting: Vascular Surgery

## 2023-08-11 VITALS — BP 126/76 | HR 72 | Temp 98.2°F | Resp 20 | Ht 73.0 in | Wt 252.0 lb

## 2023-08-11 DIAGNOSIS — N186 End stage renal disease: Secondary | ICD-10-CM

## 2023-08-11 NOTE — Progress Notes (Signed)
Patient ID: [name], male   DOB: Nov 17, 1970, 52 y.o.   MRN: [id number]  Reason for Consult: New Patient (Initial Visit)   Referred by Nonnie Done., MD  Subjective:     HPI:  [name] is a 52 y.o. male history of AV fistula placed 7 years ago.  He had a kidney transplant 1 year after that has done very well from the standpoint.  The fistula has begun getting larger over the past couple years and he is worried that he has an issue with that although he has not had any bleeding.  He only use the fistula for 3 months prior to his transplant.  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  He does not have any left upper extremity swelling.  Past Medical History:  Diagnosis Date   HNP (herniated nucleus pulposus), lumbar    Hypertension    Kidney problem    Family History  Problem Relation Age of Onset   Hypertension Mother    Diabetes Mother    COPD Father    Hypertension Brother    Past Surgical History:  Procedure Laterality Date   AV FISTULA PLACEMENT Left 05/28/2016   Procedure: LEFT ARM BRACHIOCEPHALIC ARTERIOVENOUS (AV) FISTULA CREATION;  Surgeon: Maeola Harman, MD;  Location: [facility] OR;  Service: Vascular;  Laterality: Left;   BACK SURGERY     HERNIA REPAIR  2009   REFRACTIVE SURGERY      Short Social History:  Social History   Tobacco Use   Smoking status: Never   Smokeless tobacco: Current    Types: Chew  Substance Use Topics   Alcohol use: Yes    Comment: occ    Allergies  Allergen Reactions   No Known Allergies     Current Outpatient Medications  Medication Sig Dispense Refill   amLODipine (NORVASC) 10 MG tablet Take 1 tablet (10 mg total) by mouth daily. 30 tablet 3   aspirin 325 MG tablet Take 650 mg by mouth every 4 (four) hours as needed for moderate pain or headache.     furosemide (LASIX) 40 MG tablet Take 1 tablet (40 mg total) by mouth daily. (Patient taking differently: Take 40 mg  by mouth 2 (two) times daily. ) 30 tablet 3   hydrALAZINE (APRESOLINE) 25 MG tablet Take 1 tablet (25 mg total) by mouth 3 (three) times daily. (Patient not taking: Reported on 09/22/2016) 90 tablet 3   labetalol (NORMODYNE) 300 MG tablet Take 1 tablet (300 mg total) by mouth 3 (three) times daily. (Patient taking differently: Take 150 mg by mouth 2 (two) times daily. ) 90 tablet 3   No current facility-administered medications for this visit.    Review of Systems  Constitutional:  Constitutional negative. HENT: HENT negative.  Eyes: Eyes negative.  Respiratory: Positive for shortness of breath.  Cardiovascular: Cardiovascular negative.  GI: Gastrointestinal negative.  Musculoskeletal:       Enlarging left arm AV fistula Skin: Skin negative.  Neurological: Neurological negative. Hematologic: Hematologic/lymphatic negative.  Psychiatric: Psychiatric negative.        Objective:  Objective   Vitals:   08/11/23 0826  BP: 126/76  Pulse: 72  Resp: 20  Temp: 98.2 F (36.8 C)  SpO2: 95%    Physical Exam HENT:     Head: Normocephalic.     Nose: Nose normal.  Eyes:     Pupils: Pupils are equal, round, and reactive to light.  Cardiovascular:     Rate and Rhythm: Normal rate.  Pulmonary:     Effort: Pulmonary effort is normal.  Abdominal:     General: Abdomen is flat.  Musculoskeletal:        General: Normal range of motion.     Cervical back: Normal range of motion.     Right lower leg: No edema.     Left lower leg: No edema.     Comments: Pulsatility and left upper arm very large cephalic vein fistula  Skin:    General: Skin is warm.     Capillary Refill: Capillary refill takes less than 2 seconds.  Neurological:     General: No focal deficit present.     Mental Status: He is alert.  Psychiatric:        Mood and Affect: Mood normal.        Thought Content: Thought content normal.        Judgment: Judgment normal.     Data: No studies     Assessment/Plan:     52 year old male status post left upper arm cephalic vein AV fistula creation.  This has become more tortuous and aneurysmal throughout time probably has a central stenosis but given his functioning kidney transplant I would not want to pursue any evaluation.  I discussed with the patient that we could ligate the fistula but he may needed in the future.  As long as he is not having issues I would not recommend any intervention at this time.     Maeola Harman MD [facility]

## 2024-06-09 ENCOUNTER — Other Ambulatory Visit: Payer: Self-pay | Admitting: Nephrology

## 2024-06-09 ENCOUNTER — Encounter: Payer: Self-pay | Admitting: Nephrology

## 2024-06-09 DIAGNOSIS — Z940 Kidney transplant status: Secondary | ICD-10-CM

## 2024-06-09 DIAGNOSIS — D751 Secondary polycythemia: Secondary | ICD-10-CM

## 2024-06-15 ENCOUNTER — Ambulatory Visit
Admission: RE | Admit: 2024-06-15 | Discharge: 2024-06-15 | Disposition: A | Discharge status: Home / Self Care | Source: Ambulatory Visit | Attending: Nephrology | Admitting: Nephrology

## 2024-06-15 DIAGNOSIS — D751 Secondary polycythemia: Secondary | ICD-10-CM

## 2024-06-15 DIAGNOSIS — Z940 Kidney transplant status: Secondary | ICD-10-CM
